# Patient Record
Sex: Female | Born: 1950 | Race: White | Hispanic: No | Marital: Single
Health system: Southern US, Community
[De-identification: ages and names within clinical notes are randomized; demographics above are authoritative.]

---

## 2011-02-20 ENCOUNTER — Inpatient Hospital Stay
Admission: AD | Admit: 2011-02-20 | Discharge: 2011-03-17 | Disposition: A | Discharge status: Skilled Nursing Facility | Payer: Self-pay | Source: Ambulatory Visit | Attending: Internal Medicine | Admitting: Internal Medicine

## 2011-02-20 ENCOUNTER — Other Ambulatory Visit (HOSPITAL_COMMUNITY): Payer: Self-pay

## 2011-02-20 LAB — BLOOD GAS, ARTERIAL
Bicarbonate: 25.9 mEq/L — ABNORMAL HIGH (ref 20.0–24.0)
Patient temperature: 98.6
TCO2: 27.3 mmol/L (ref 0–100)
pH, Arterial: 7.392 (ref 7.350–7.400)

## 2011-02-20 LAB — PROTIME-INR
INR: 1.48 (ref 0.00–1.49)
Prothrombin Time: 18.2 seconds — ABNORMAL HIGH (ref 11.6–15.2)

## 2011-02-20 LAB — PHOSPHORUS: Phosphorus: 3.8 mg/dL (ref 2.3–4.6)

## 2011-02-20 LAB — CBC
HCT: 31.5 % — ABNORMAL LOW (ref 36.0–46.0)
Hemoglobin: 9.9 g/dL — ABNORMAL LOW (ref 12.0–15.0)
MCHC: 31.4 g/dL (ref 30.0–36.0)
RBC: 3.01 MIL/uL — ABNORMAL LOW (ref 3.87–5.11)

## 2011-02-20 LAB — BASIC METABOLIC PANEL
CO2: 27 mEq/L (ref 19–32)
Calcium: 9.9 mg/dL (ref 8.4–10.5)
Chloride: 114 mEq/L — ABNORMAL HIGH (ref 96–112)
Glucose, Bld: 184 mg/dL — ABNORMAL HIGH (ref 70–99)
Potassium: 3.7 mEq/L (ref 3.5–5.1)
Sodium: 150 mEq/L — ABNORMAL HIGH (ref 135–145)

## 2011-02-20 LAB — LITHIUM LEVEL: Lithium Lvl: 1.04 mEq/L (ref 0.80–1.40)

## 2011-02-20 LAB — AMMONIA: Ammonia: 52 umol/L (ref 11–60)

## 2011-02-21 ENCOUNTER — Other Ambulatory Visit (HOSPITAL_COMMUNITY): Payer: Self-pay

## 2011-02-21 ENCOUNTER — Inpatient Hospital Stay (HOSPITAL_COMMUNITY)
Admission: AD | Admit: 2011-02-21 | Discharge: 2011-02-21 | Disposition: A | Discharge status: Home / Self Care | Payer: Self-pay | Source: Ambulatory Visit | Attending: Internal Medicine | Admitting: Internal Medicine

## 2011-02-21 DIAGNOSIS — J810 Acute pulmonary edema: Secondary | ICD-10-CM

## 2011-02-21 DIAGNOSIS — J690 Pneumonitis due to inhalation of food and vomit: Secondary | ICD-10-CM

## 2011-02-21 DIAGNOSIS — G9341 Metabolic encephalopathy: Secondary | ICD-10-CM

## 2011-02-21 DIAGNOSIS — R0902 Hypoxemia: Secondary | ICD-10-CM

## 2011-02-21 DIAGNOSIS — K921 Melena: Secondary | ICD-10-CM

## 2011-02-21 DIAGNOSIS — K7290 Hepatic failure, unspecified without coma: Secondary | ICD-10-CM

## 2011-02-21 LAB — BASIC METABOLIC PANEL
BUN: 57 mg/dL — ABNORMAL HIGH (ref 6–23)
CO2: 27 mEq/L (ref 19–32)
Calcium: 9.1 mg/dL (ref 8.4–10.5)
Creatinine, Ser: 2.03 mg/dL — ABNORMAL HIGH (ref 0.50–1.10)
GFR calc non Af Amer: 26 mL/min — ABNORMAL LOW (ref >90)
Glucose, Bld: 501 mg/dL — ABNORMAL HIGH (ref 70–99)

## 2011-02-21 LAB — CBC
HCT: 31.4 % — ABNORMAL LOW (ref 36.0–46.0)
MCHC: 30.6 g/dL (ref 30.0–36.0)
MCV: 105.7 fL — ABNORMAL HIGH (ref 78.0–100.0)
Platelets: 72 10*3/uL — ABNORMAL LOW (ref 150–400)
RDW: 16.5 % — ABNORMAL HIGH (ref 11.5–15.5)
WBC: 9.3 10*3/uL (ref 4.0–10.5)

## 2011-02-22 DIAGNOSIS — K921 Melena: Secondary | ICD-10-CM

## 2011-02-22 DIAGNOSIS — K7460 Unspecified cirrhosis of liver: Secondary | ICD-10-CM

## 2011-02-22 DIAGNOSIS — K766 Portal hypertension: Secondary | ICD-10-CM

## 2011-02-22 DIAGNOSIS — K7290 Hepatic failure, unspecified without coma: Secondary | ICD-10-CM

## 2011-02-22 LAB — CBC
HCT: 31.3 % — ABNORMAL LOW (ref 36.0–46.0)
HCT: 30.3 % — ABNORMAL LOW (ref 36.0–46.0)
Hemoglobin: 9.5 g/dL — ABNORMAL LOW (ref 12.0–15.0)
Hemoglobin: 9.3 g/dL — ABNORMAL LOW (ref 12.0–15.0)
MCH: 32.6 pg (ref 26.0–34.0)
MCHC: 30.7 g/dL (ref 30.0–36.0)
MCHC: 30.3 g/dL (ref 30.0–36.0)
MCV: 105.7 fL — ABNORMAL HIGH (ref 78.0–100.0)
MCV: 106.3 fL — ABNORMAL HIGH (ref 78.0–100.0)
MCV: 107.3 fL — ABNORMAL HIGH (ref 78.0–100.0)
Platelets: 65 10*3/uL — ABNORMAL LOW (ref 150–400)
Platelets: 66 10*3/uL — ABNORMAL LOW (ref 150–400)
RBC: 2.85 MIL/uL — ABNORMAL LOW (ref 3.87–5.11)
RDW: 16.3 % — ABNORMAL HIGH (ref 11.5–15.5)
RDW: 16.2 % — ABNORMAL HIGH (ref 11.5–15.5)
RDW: 16.2 % — ABNORMAL HIGH (ref 11.5–15.5)
WBC: 7.3 10*3/uL (ref 4.0–10.5)
WBC: 6.2 10*3/uL (ref 4.0–10.5)
WBC: 5.7 10*3/uL (ref 4.0–10.5)

## 2011-02-22 LAB — COMPREHENSIVE METABOLIC PANEL
ALT: 50 U/L — ABNORMAL HIGH (ref 0–35)
AST: 59 U/L — ABNORMAL HIGH (ref 0–37)
Albumin: 2.9 g/dL — ABNORMAL LOW (ref 3.5–5.2)
Alkaline Phosphatase: 293 U/L — ABNORMAL HIGH (ref 39–117)
BUN: 56 mg/dL — ABNORMAL HIGH (ref 6–23)
CO2: 28 mEq/L (ref 19–32)
Calcium: 9.3 mg/dL (ref 8.4–10.5)
Chloride: 118 mEq/L — ABNORMAL HIGH (ref 96–112)
Creatinine, Ser: 1.85 mg/dL — ABNORMAL HIGH (ref 0.50–1.10)
GFR calc Af Amer: 33 mL/min — ABNORMAL LOW (ref >90)
GFR calc non Af Amer: 29 mL/min — ABNORMAL LOW (ref >90)
Glucose, Bld: 225 mg/dL — ABNORMAL HIGH (ref 70–99)
Potassium: 3.6 mEq/L (ref 3.5–5.1)
Sodium: 151 mEq/L — ABNORMAL HIGH (ref 135–145)
Total Bilirubin: 2.3 mg/dL — ABNORMAL HIGH (ref 0.3–1.2)
Total Protein: 5.8 g/dL — ABNORMAL LOW (ref 6.0–8.3)

## 2011-02-22 LAB — PROTIME-INR
INR: 1.5 — ABNORMAL HIGH (ref 0.00–1.49)
Prothrombin Time: 18.4 seconds — ABNORMAL HIGH (ref 11.6–15.2)

## 2011-02-22 LAB — TSH: TSH: 1.152 u[IU]/mL (ref 0.350–4.500)

## 2011-02-23 ENCOUNTER — Other Ambulatory Visit (HOSPITAL_COMMUNITY): Payer: Self-pay

## 2011-02-23 LAB — COMPREHENSIVE METABOLIC PANEL
ALT: 47 U/L — ABNORMAL HIGH (ref 0–35)
AST: 49 U/L — ABNORMAL HIGH (ref 0–37)
Albumin: 3.1 g/dL — ABNORMAL LOW (ref 3.5–5.2)
Alkaline Phosphatase: 279 U/L — ABNORMAL HIGH (ref 39–117)
Glucose, Bld: 247 mg/dL — ABNORMAL HIGH (ref 70–99)
Potassium: 3.8 mEq/L (ref 3.5–5.1)
Sodium: 153 mEq/L — ABNORMAL HIGH (ref 135–145)
Total Protein: 5.8 g/dL — ABNORMAL LOW (ref 6.0–8.3)

## 2011-02-23 LAB — CBC
Hemoglobin: 9.4 g/dL — ABNORMAL LOW (ref 12.0–15.0)
MCHC: 30.6 g/dL (ref 30.0–36.0)
Platelets: 78 10*3/uL — ABNORMAL LOW (ref 150–400)
RDW: 16.1 % — ABNORMAL HIGH (ref 11.5–15.5)

## 2011-02-23 LAB — CULTURE, BLOOD (ROUTINE X 2)

## 2011-02-24 DIAGNOSIS — K766 Portal hypertension: Secondary | ICD-10-CM

## 2011-02-24 DIAGNOSIS — K7682 Hepatic encephalopathy: Secondary | ICD-10-CM

## 2011-02-24 DIAGNOSIS — K7460 Unspecified cirrhosis of liver: Secondary | ICD-10-CM

## 2011-02-24 DIAGNOSIS — J810 Acute pulmonary edema: Secondary | ICD-10-CM

## 2011-02-24 DIAGNOSIS — R040 Epistaxis: Secondary | ICD-10-CM

## 2011-02-24 DIAGNOSIS — J690 Pneumonitis due to inhalation of food and vomit: Secondary | ICD-10-CM

## 2011-02-24 DIAGNOSIS — R0902 Hypoxemia: Secondary | ICD-10-CM

## 2011-02-24 DIAGNOSIS — K7290 Hepatic failure, unspecified without coma: Secondary | ICD-10-CM

## 2011-02-24 DIAGNOSIS — K921 Melena: Secondary | ICD-10-CM

## 2011-02-24 LAB — CBC
HCT: 30.7 % — ABNORMAL LOW (ref 36.0–46.0)
Hemoglobin: 9.3 g/dL — ABNORMAL LOW (ref 12.0–15.0)
MCH: 32.2 pg (ref 26.0–34.0)
MCV: 106.2 fL — ABNORMAL HIGH (ref 78.0–100.0)
RBC: 2.89 MIL/uL — ABNORMAL LOW (ref 3.87–5.11)
WBC: 4.8 10*3/uL (ref 4.0–10.5)

## 2011-02-24 LAB — COMPREHENSIVE METABOLIC PANEL
CO2: 28 mEq/L (ref 19–32)
Calcium: 9.1 mg/dL (ref 8.4–10.5)
Chloride: 115 mEq/L — ABNORMAL HIGH (ref 96–112)
Creatinine, Ser: 1.62 mg/dL — ABNORMAL HIGH (ref 0.50–1.10)
GFR calc Af Amer: 39 mL/min — ABNORMAL LOW (ref >90)
GFR calc non Af Amer: 34 mL/min — ABNORMAL LOW (ref >90)
Glucose, Bld: 303 mg/dL — ABNORMAL HIGH (ref 70–99)
Total Bilirubin: 2 mg/dL — ABNORMAL HIGH (ref 0.3–1.2)

## 2011-02-24 LAB — CLOSTRIDIUM DIFFICILE BY PCR: Toxigenic C. Difficile by PCR: NEGATIVE

## 2011-02-24 NOTE — Procedures (Signed)
EEG NUMBER:  [id number].  This routine EEG was requested in this 60 year old woman who is on CPAP and admitted with a metabolic encephalopathy with loss of consciousness. Her medications include lamotrigine.  The EEG was done with the patient awake and drowsy.  During periods of maximal wakefulness, background activities are characterized by low to medium amplitude, mixed frequency theta activities.  Note was made of sporadic triphasic-like waves that were frontally dominant, and had an AP lag.  These seemed to wax and wane with state.  Photic stimulation did not produce clear driving response.  The patient was not hyperventilated.  The patient did appear to fall asleep.  This was characterized by the emergence of slower, poorly organized delta activities.  Again seen were the triphasic waves.  CLINICAL INTERPRETATION:  This routine EEG done with the patient awake and drowsy is abnormal.  Background activities are too slow for her age and state suggesting a moderate encephalopathy of nonspecific etiology. No electrographic seizures, epileptiform discharges or nonconvulsive status epilepticus was seen.          ______________________________ Denton Meek, MD    [id number] D:  02/21/2011 16:32:17  T:  02/21/2011 16:56:17  Job #:  [id number]

## 2011-02-25 LAB — CROSSMATCH
DAT, IgG: NEGATIVE
Donor AG Type: NEGATIVE
Unit division: 0
Unit division: 0

## 2011-02-25 LAB — BASIC METABOLIC PANEL
BUN: 40 mg/dL — ABNORMAL HIGH (ref 6–23)
Calcium: 8.9 mg/dL (ref 8.4–10.5)
GFR calc Af Amer: 45 mL/min — ABNORMAL LOW (ref >90)
GFR calc non Af Amer: 39 mL/min — ABNORMAL LOW (ref >90)
Glucose, Bld: 286 mg/dL — ABNORMAL HIGH (ref 70–99)
Potassium: 3.8 mEq/L (ref 3.5–5.1)
Sodium: 146 mEq/L — ABNORMAL HIGH (ref 135–145)

## 2011-02-26 LAB — CBC
Hemoglobin: 9.4 g/dL — ABNORMAL LOW (ref 12.0–15.0)
MCH: 32.8 pg (ref 26.0–34.0)
MCHC: 31.6 g/dL (ref 30.0–36.0)
Platelets: 63 10*3/uL — ABNORMAL LOW (ref 150–400)
RDW: 16.1 % — ABNORMAL HIGH (ref 11.5–15.5)

## 2011-02-26 LAB — BASIC METABOLIC PANEL
Calcium: 8.7 mg/dL (ref 8.4–10.5)
GFR calc Af Amer: 43 mL/min — ABNORMAL LOW (ref >90)
GFR calc non Af Amer: 37 mL/min — ABNORMAL LOW (ref >90)
Potassium: 3.9 mEq/L (ref 3.5–5.1)
Sodium: 144 mEq/L (ref 135–145)

## 2011-02-27 ENCOUNTER — Other Ambulatory Visit (HOSPITAL_COMMUNITY): Payer: Self-pay

## 2011-02-27 LAB — CULTURE, BLOOD (ROUTINE X 2)
Culture  Setup Time: 201210120143
Culture: NO GROWTH

## 2011-02-27 LAB — BASIC METABOLIC PANEL
BUN: 41 mg/dL — ABNORMAL HIGH (ref 6–23)
CO2: 26 mEq/L (ref 19–32)
Calcium: 8.8 mg/dL (ref 8.4–10.5)
Creatinine, Ser: 1.5 mg/dL — ABNORMAL HIGH (ref 0.50–1.10)
GFR calc non Af Amer: 37 mL/min — ABNORMAL LOW (ref >90)
Glucose, Bld: 254 mg/dL — ABNORMAL HIGH (ref 70–99)
Sodium: 142 mEq/L (ref 135–145)

## 2011-02-28 LAB — CBC
HCT: 28.5 % — ABNORMAL LOW (ref 36.0–46.0)
MCH: 32.1 pg (ref 26.0–34.0)
MCV: 102.9 fL — ABNORMAL HIGH (ref 78.0–100.0)
Platelets: 57 10*3/uL — ABNORMAL LOW (ref 150–400)
RDW: 16.5 % — ABNORMAL HIGH (ref 11.5–15.5)
WBC: 5.9 10*3/uL (ref 4.0–10.5)

## 2011-02-28 LAB — BASIC METABOLIC PANEL
BUN: 44 mg/dL — ABNORMAL HIGH (ref 6–23)
Calcium: 8.9 mg/dL (ref 8.4–10.5)
Chloride: 108 mEq/L (ref 96–112)
Creatinine, Ser: 1.64 mg/dL — ABNORMAL HIGH (ref 0.50–1.10)
GFR calc Af Amer: 39 mL/min — ABNORMAL LOW (ref >90)

## 2011-03-01 LAB — BASIC METABOLIC PANEL
CO2: 27 mEq/L (ref 19–32)
Calcium: 8.8 mg/dL (ref 8.4–10.5)
Creatinine, Ser: 1.68 mg/dL — ABNORMAL HIGH (ref 0.50–1.10)
GFR calc non Af Amer: 32 mL/min — ABNORMAL LOW (ref >90)
Glucose, Bld: 160 mg/dL — ABNORMAL HIGH (ref 70–99)

## 2011-03-01 LAB — CULTURE, BLOOD (ROUTINE X 2)
Culture  Setup Time: 201210141853
Culture: NO GROWTH

## 2011-03-01 LAB — MAGNESIUM: Magnesium: 2.7 mg/dL — ABNORMAL HIGH (ref 1.5–2.5)

## 2011-03-02 LAB — BASIC METABOLIC PANEL
BUN: 48 mg/dL — ABNORMAL HIGH (ref 6–23)
Chloride: 103 mEq/L (ref 96–112)
Creatinine, Ser: 1.68 mg/dL — ABNORMAL HIGH (ref 0.50–1.10)
GFR calc Af Amer: 37 mL/min — ABNORMAL LOW (ref >90)
GFR calc non Af Amer: 32 mL/min — ABNORMAL LOW (ref >90)
Potassium: 4.6 mEq/L (ref 3.5–5.1)

## 2011-03-02 LAB — CBC
HCT: 25.7 % — ABNORMAL LOW (ref 36.0–46.0)
MCHC: 32.3 g/dL (ref 30.0–36.0)
MCV: 102 fL — ABNORMAL HIGH (ref 78.0–100.0)
Platelets: 52 10*3/uL — ABNORMAL LOW (ref 150–400)
RDW: 16.5 % — ABNORMAL HIGH (ref 11.5–15.5)

## 2011-03-02 LAB — MAGNESIUM: Magnesium: 2.8 mg/dL — ABNORMAL HIGH (ref 1.5–2.5)

## 2011-03-03 LAB — CULTURE, BLOOD (SINGLE): Culture  Setup Time: 201210160220

## 2011-03-03 LAB — URINALYSIS, MICROSCOPIC ONLY
Bilirubin Urine: NEGATIVE
Ketones, ur: NEGATIVE mg/dL
Protein, ur: NEGATIVE mg/dL
Urobilinogen, UA: 0.2 mg/dL (ref 0.0–1.0)

## 2011-03-03 LAB — BASIC METABOLIC PANEL
Calcium: 9 mg/dL (ref 8.4–10.5)
Creatinine, Ser: 1.67 mg/dL — ABNORMAL HIGH (ref 0.50–1.10)
GFR calc Af Amer: 38 mL/min — ABNORMAL LOW (ref >90)
GFR calc non Af Amer: 33 mL/min — ABNORMAL LOW (ref >90)
Sodium: 138 mEq/L (ref 135–145)

## 2011-03-03 LAB — CALCIUM, IONIZED: Calcium, Ion: 1.19 mmol/L (ref 1.12–1.32)

## 2011-03-03 LAB — CBC
MCH: 32.5 pg (ref 26.0–34.0)
MCV: 101.6 fL — ABNORMAL HIGH (ref 78.0–100.0)
Platelets: 45 10*3/uL — ABNORMAL LOW (ref 150–400)
RDW: 16.6 % — ABNORMAL HIGH (ref 11.5–15.5)

## 2011-03-03 LAB — MAGNESIUM: Magnesium: 2.7 mg/dL — ABNORMAL HIGH (ref 1.5–2.5)

## 2011-03-04 ENCOUNTER — Other Ambulatory Visit (HOSPITAL_COMMUNITY): Payer: Self-pay

## 2011-03-04 DIAGNOSIS — I059 Rheumatic mitral valve disease, unspecified: Secondary | ICD-10-CM

## 2011-03-04 LAB — BASIC METABOLIC PANEL
Chloride: 104 mEq/L (ref 96–112)
Creatinine, Ser: 1.63 mg/dL — ABNORMAL HIGH (ref 0.50–1.10)
GFR calc Af Amer: 39 mL/min — ABNORMAL LOW (ref >90)
Potassium: 3.8 mEq/L (ref 3.5–5.1)
Sodium: 140 mEq/L (ref 135–145)

## 2011-03-06 LAB — CBC
HCT: 23.2 % — ABNORMAL LOW (ref 36.0–46.0)
RBC: 2.34 MIL/uL — ABNORMAL LOW (ref 3.87–5.11)
RDW: 16.2 % — ABNORMAL HIGH (ref 11.5–15.5)
WBC: 4.3 10*3/uL (ref 4.0–10.5)

## 2011-03-06 LAB — BASIC METABOLIC PANEL
BUN: 44 mg/dL — ABNORMAL HIGH (ref 6–23)
Chloride: 102 mEq/L (ref 96–112)
GFR calc Af Amer: 37 mL/min — ABNORMAL LOW (ref >90)
Potassium: 3.3 mEq/L — ABNORMAL LOW (ref 3.5–5.1)

## 2011-03-06 LAB — HEMOGLOBIN AND HEMATOCRIT, BLOOD
HCT: 26.4 % — ABNORMAL LOW (ref 36.0–46.0)
Hemoglobin: 8.5 g/dL — ABNORMAL LOW (ref 12.0–15.0)

## 2011-03-06 LAB — IRON AND TIBC: Iron: 56 ug/dL (ref 42–135)

## 2011-03-06 LAB — MAGNESIUM: Magnesium: 2.5 mg/dL (ref 1.5–2.5)

## 2011-03-06 NOTE — Consult Note (Signed)
  NAMEJENNET, Bethany [name]                 ACCOUNT NO.:  [id number]  MEDICAL RECORD NO.:  [id number]  LOCATION:                                 FACILITY:  PHYSICIAN:  Noralyn Pick. Eden Emms, MD, [facility]   DATE OF BIRTH:  DATE OF CONSULTATION: DATE OF DISCHARGE:                                CONSULTATION   TRANSESOPHAGEAL ECHO  INDICATION:  A 60 year old patient with question sepsis, rule out SBE.  The patient was extremely debilitated.  She was extremely high risk for aspiration.  She has had this clinically before.  She is morbidly obese. Mental status was off and on.  The patient has cirrhosis.  We managed to give her 6 mg of Versed and did not feel comfortable giving her any more.  She had copious secretions and had to be suctioned throughout the case.  However, despite all this, the TEE was performed in a safe manner with sating above 90%.  A note should be made that the primary service was concerned about SBE, however, I noted that her last blood cultures were negative and I saw no evidence of previous transthoracic echo being done.  Left ventricular cavity size and function were normal.  EF was 60%. There was no regional motion abnormality.  No mural apical thrombus. Mitral valve was mildly thickened.  There was no SBE.  There was mild MR.  Left atrium was normal.  Right atrium was mildly dilated.  Right ventricle was normal.  Tricuspid valve was mildly thickened with moderate TR which I suspect is from her underlying sleep apnea or lung disease.  Aortic valve was trileaflet, normal.  Pulmonary valve was normal with trivial PR.  There was no evidence of SBE.  Atrial septum was intact with no PFO and no ASD.  Left atrial appendage was normal with no spontaneous contrast or thrombus.  Imaging of the aorta showed no debris.  FINAL IMPRESSION: 1. No subacute bacterial endocarditis. 2. Moderate tricuspid regurgitation. 3. Mild mitral regurgitation. 4. Normal aortic valve. 5. Normal  pulmonary valve. 6. Normal left ventricular ejection fraction 60%. 7. Normal right ventricle. 8. Mild right atrial enlargement. 9. No aortic debris.     Noralyn Pick. Eden Emms, MD, [facility]     PCN/MEDQ  D:  03/04/2011  T:  03/04/2011  Job:  [id number]  Electronically Signed by Charlton Haws MD [facility] on 03/06/2011 12:29:56 PM

## 2011-03-07 LAB — BASIC METABOLIC PANEL
Chloride: 101 mEq/L (ref 96–112)
Creatinine, Ser: 1.6 mg/dL — ABNORMAL HIGH (ref 0.50–1.10)
GFR calc Af Amer: 40 mL/min — ABNORMAL LOW (ref >90)
Potassium: 4 mEq/L (ref 3.5–5.1)

## 2011-03-07 LAB — HEMOGLOBIN AND HEMATOCRIT, BLOOD
HCT: 24.8 % — ABNORMAL LOW (ref 36.0–46.0)
Hemoglobin: 8.1 g/dL — ABNORMAL LOW (ref 12.0–15.0)

## 2011-03-07 LAB — MAGNESIUM: Magnesium: 2.4 mg/dL (ref 1.5–2.5)

## 2011-03-08 LAB — CBC
HCT: 23 % — ABNORMAL LOW (ref 36.0–46.0)
Hemoglobin: 7.6 g/dL — ABNORMAL LOW (ref 12.0–15.0)
MCV: 98.7 fL (ref 78.0–100.0)
Platelets: 41 10*3/uL — ABNORMAL LOW (ref 150–400)
RBC: 2.33 MIL/uL — ABNORMAL LOW (ref 3.87–5.11)
WBC: 5.3 10*3/uL (ref 4.0–10.5)

## 2011-03-08 LAB — BASIC METABOLIC PANEL
BUN: 32 mg/dL — ABNORMAL HIGH (ref 6–23)
CO2: 29 mEq/L (ref 19–32)
Chloride: 100 mEq/L (ref 96–112)
Creatinine, Ser: 1.62 mg/dL — ABNORMAL HIGH (ref 0.50–1.10)
Glucose, Bld: 123 mg/dL — ABNORMAL HIGH (ref 70–99)

## 2011-03-08 LAB — MAGNESIUM: Magnesium: 2.3 mg/dL (ref 1.5–2.5)

## 2011-03-09 LAB — HEMOGLOBIN AND HEMATOCRIT, BLOOD: HCT: 25.1 % — ABNORMAL LOW (ref 36.0–46.0)

## 2011-03-10 LAB — COMPREHENSIVE METABOLIC PANEL
ALT: 40 U/L — ABNORMAL HIGH (ref 0–35)
AST: 58 U/L — ABNORMAL HIGH (ref 0–37)
CO2: 30 mEq/L (ref 19–32)
Chloride: 100 mEq/L (ref 96–112)
GFR calc non Af Amer: 30 mL/min — ABNORMAL LOW (ref >90)
Sodium: 138 mEq/L (ref 135–145)
Total Bilirubin: 2.2 mg/dL — ABNORMAL HIGH (ref 0.3–1.2)

## 2011-03-10 LAB — CBC
Hemoglobin: 8.3 g/dL — ABNORMAL LOW (ref 12.0–15.0)
MCV: 98.1 fL (ref 78.0–100.0)
Platelets: 47 10*3/uL — ABNORMAL LOW (ref 150–400)
RBC: 2.58 MIL/uL — ABNORMAL LOW (ref 3.87–5.11)
WBC: 4.8 10*3/uL (ref 4.0–10.5)

## 2011-03-10 LAB — CROSSMATCH
Donor AG Type: NEGATIVE
Unit division: 0

## 2011-03-10 LAB — DIFFERENTIAL
Eosinophils Absolute: 0.2 10*3/uL (ref 0.0–0.7)
Lymphs Abs: 1.1 10*3/uL (ref 0.7–4.0)
Neutro Abs: 3.1 10*3/uL (ref 1.7–7.7)
Neutrophils Relative %: 64 % (ref 43–77)

## 2011-03-12 LAB — DIFFERENTIAL
Basophils Absolute: 0 10*3/uL (ref 0.0–0.1)
Basophils Relative: 0 % (ref 0–1)
Eosinophils Absolute: 0.2 10*3/uL (ref 0.0–0.7)
Eosinophils Relative: 5 % (ref 0–5)
Monocytes Absolute: 0.5 10*3/uL (ref 0.1–1.0)

## 2011-03-12 LAB — CBC
MCHC: 32.3 g/dL (ref 30.0–36.0)
RDW: 16.5 % — ABNORMAL HIGH (ref 11.5–15.5)

## 2011-03-12 LAB — PROTIME-INR
INR: 1.18 (ref 0.00–1.49)
Prothrombin Time: 15.3 seconds — ABNORMAL HIGH (ref 11.6–15.2)

## 2011-03-12 LAB — COMPREHENSIVE METABOLIC PANEL
Albumin: 2.9 g/dL — ABNORMAL LOW (ref 3.5–5.2)
BUN: 24 mg/dL — ABNORMAL HIGH (ref 6–23)
Chloride: 98 mEq/L (ref 96–112)
Creatinine, Ser: 1.66 mg/dL — ABNORMAL HIGH (ref 0.50–1.10)
Total Bilirubin: 2.5 mg/dL — ABNORMAL HIGH (ref 0.3–1.2)
Total Protein: 5.9 g/dL — ABNORMAL LOW (ref 6.0–8.3)

## 2011-03-14 LAB — BASIC METABOLIC PANEL
GFR calc non Af Amer: 32 mL/min — ABNORMAL LOW (ref >90)
Glucose, Bld: 150 mg/dL — ABNORMAL HIGH (ref 70–99)
Potassium: 3.5 mEq/L (ref 3.5–5.1)
Sodium: 135 mEq/L (ref 135–145)

## 2011-03-16 LAB — BASIC METABOLIC PANEL
BUN: 17 mg/dL (ref 6–23)
Chloride: 99 mEq/L (ref 96–112)
GFR calc Af Amer: 42 mL/min — ABNORMAL LOW (ref >90)
Potassium: 4.6 mEq/L (ref 3.5–5.1)
Sodium: 135 mEq/L (ref 135–145)

## 2011-03-16 LAB — CBC
HCT: 25 % — ABNORMAL LOW (ref 36.0–46.0)
Hemoglobin: 8 g/dL — ABNORMAL LOW (ref 12.0–15.0)
MCHC: 32 g/dL (ref 30.0–36.0)
RBC: 2.48 MIL/uL — ABNORMAL LOW (ref 3.87–5.11)
WBC: 4 10*3/uL (ref 4.0–10.5)

## 2011-03-16 LAB — DIFFERENTIAL
Lymphocytes Relative: 21 % (ref 12–46)
Lymphs Abs: 0.9 10*3/uL (ref 0.7–4.0)
Monocytes Absolute: 0.4 10*3/uL (ref 0.1–1.0)
Monocytes Relative: 11 % (ref 3–12)
Neutro Abs: 2.6 10*3/uL (ref 1.7–7.7)

## 2011-03-17 LAB — CBC
HCT: 24.9 % — ABNORMAL LOW (ref 36.0–46.0)
Hemoglobin: 7.8 g/dL — ABNORMAL LOW (ref 12.0–15.0)
MCH: 31.6 pg (ref 26.0–34.0)
MCHC: 31.3 g/dL (ref 30.0–36.0)
RBC: 2.47 MIL/uL — ABNORMAL LOW (ref 3.87–5.11)

## 2011-03-17 LAB — DIFFERENTIAL
Lymphs Abs: 1.1 10*3/uL (ref 0.7–4.0)
Monocytes Relative: 10 % (ref 3–12)
Neutro Abs: 2.9 10*3/uL (ref 1.7–7.7)
Neutrophils Relative %: 62 % (ref 43–77)

## 2011-03-17 LAB — BASIC METABOLIC PANEL
BUN: 15 mg/dL (ref 6–23)
Chloride: 99 mEq/L (ref 96–112)
GFR calc Af Amer: 43 mL/min — ABNORMAL LOW (ref >90)
Glucose, Bld: 138 mg/dL — ABNORMAL HIGH (ref 70–99)
Potassium: 3.8 mEq/L (ref 3.5–5.1)

## 2013-10-21 IMAGING — CR DG ABD PORTABLE 1V
2 series · 2 of 2 positions shown · non-contrast
Comparison: 02/27/2011.

CLINICAL DATA: Cough.  Nausea and vomiting.

ABDOMEN - 1 VIEW

[AP (1 of 2)]
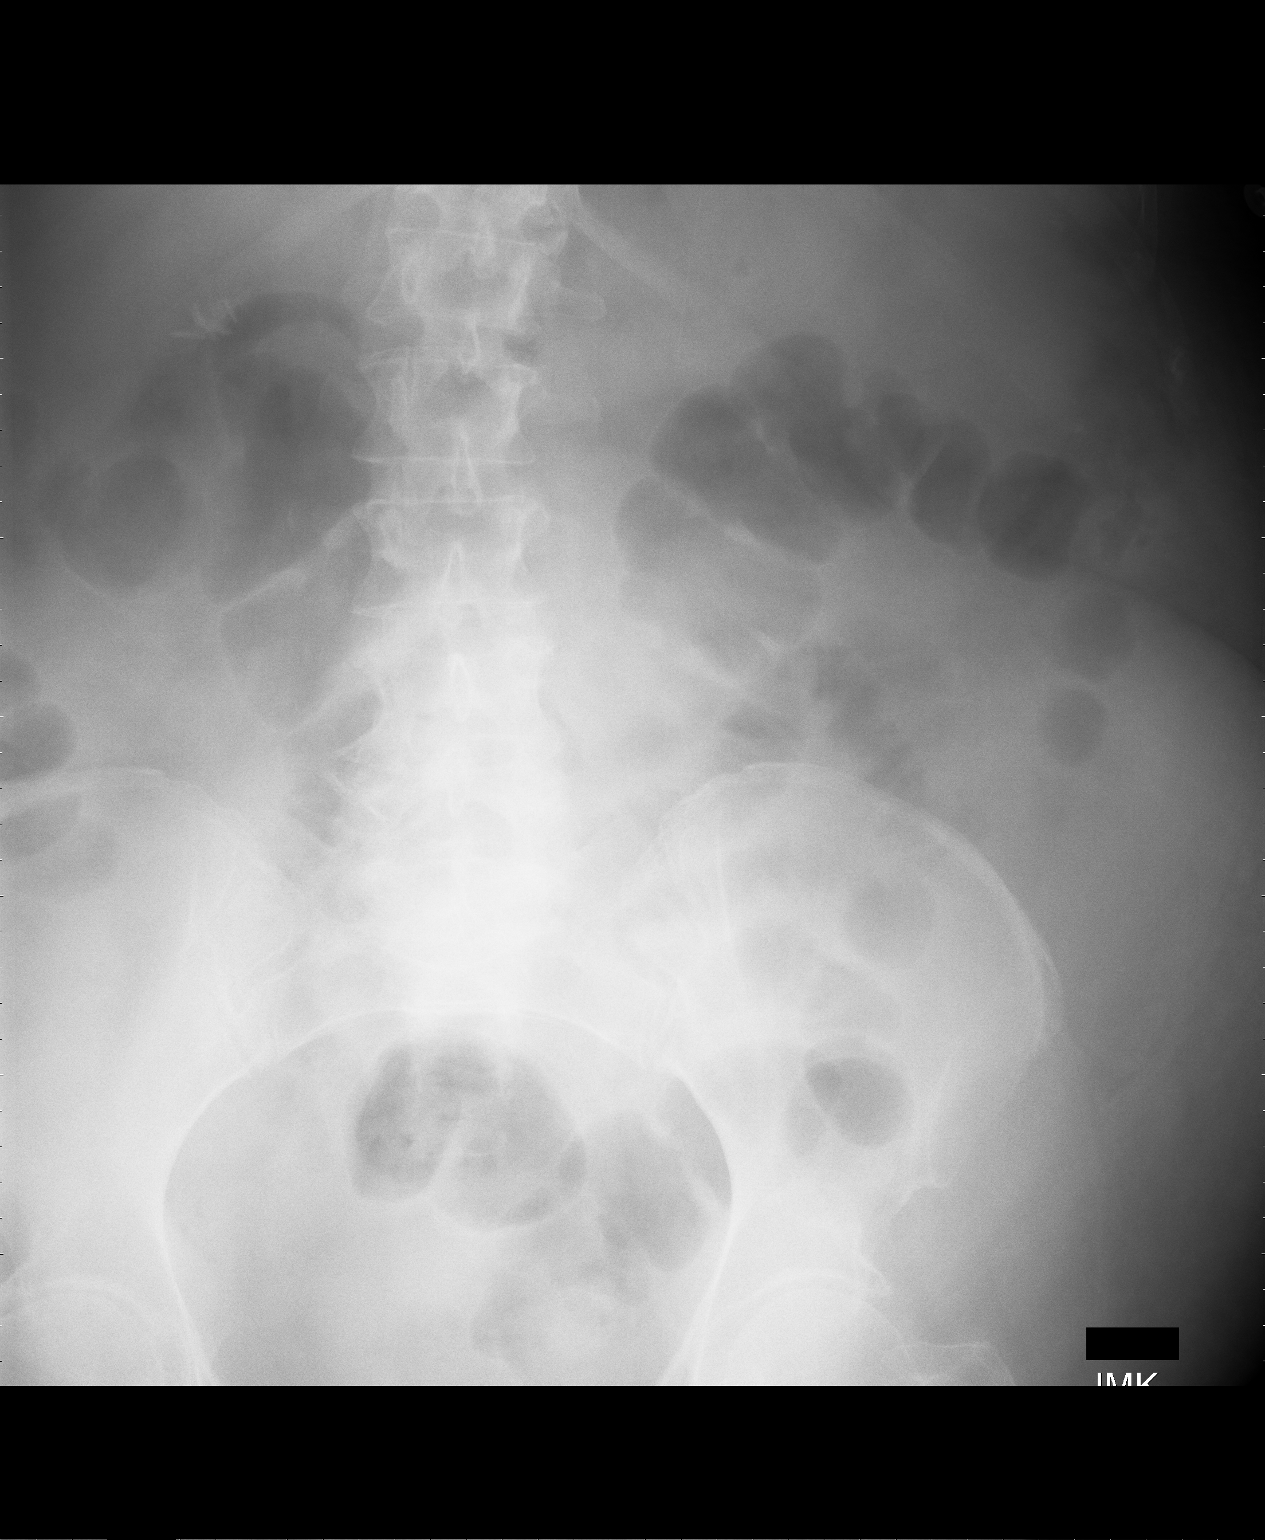

[AP (2 of 2)]
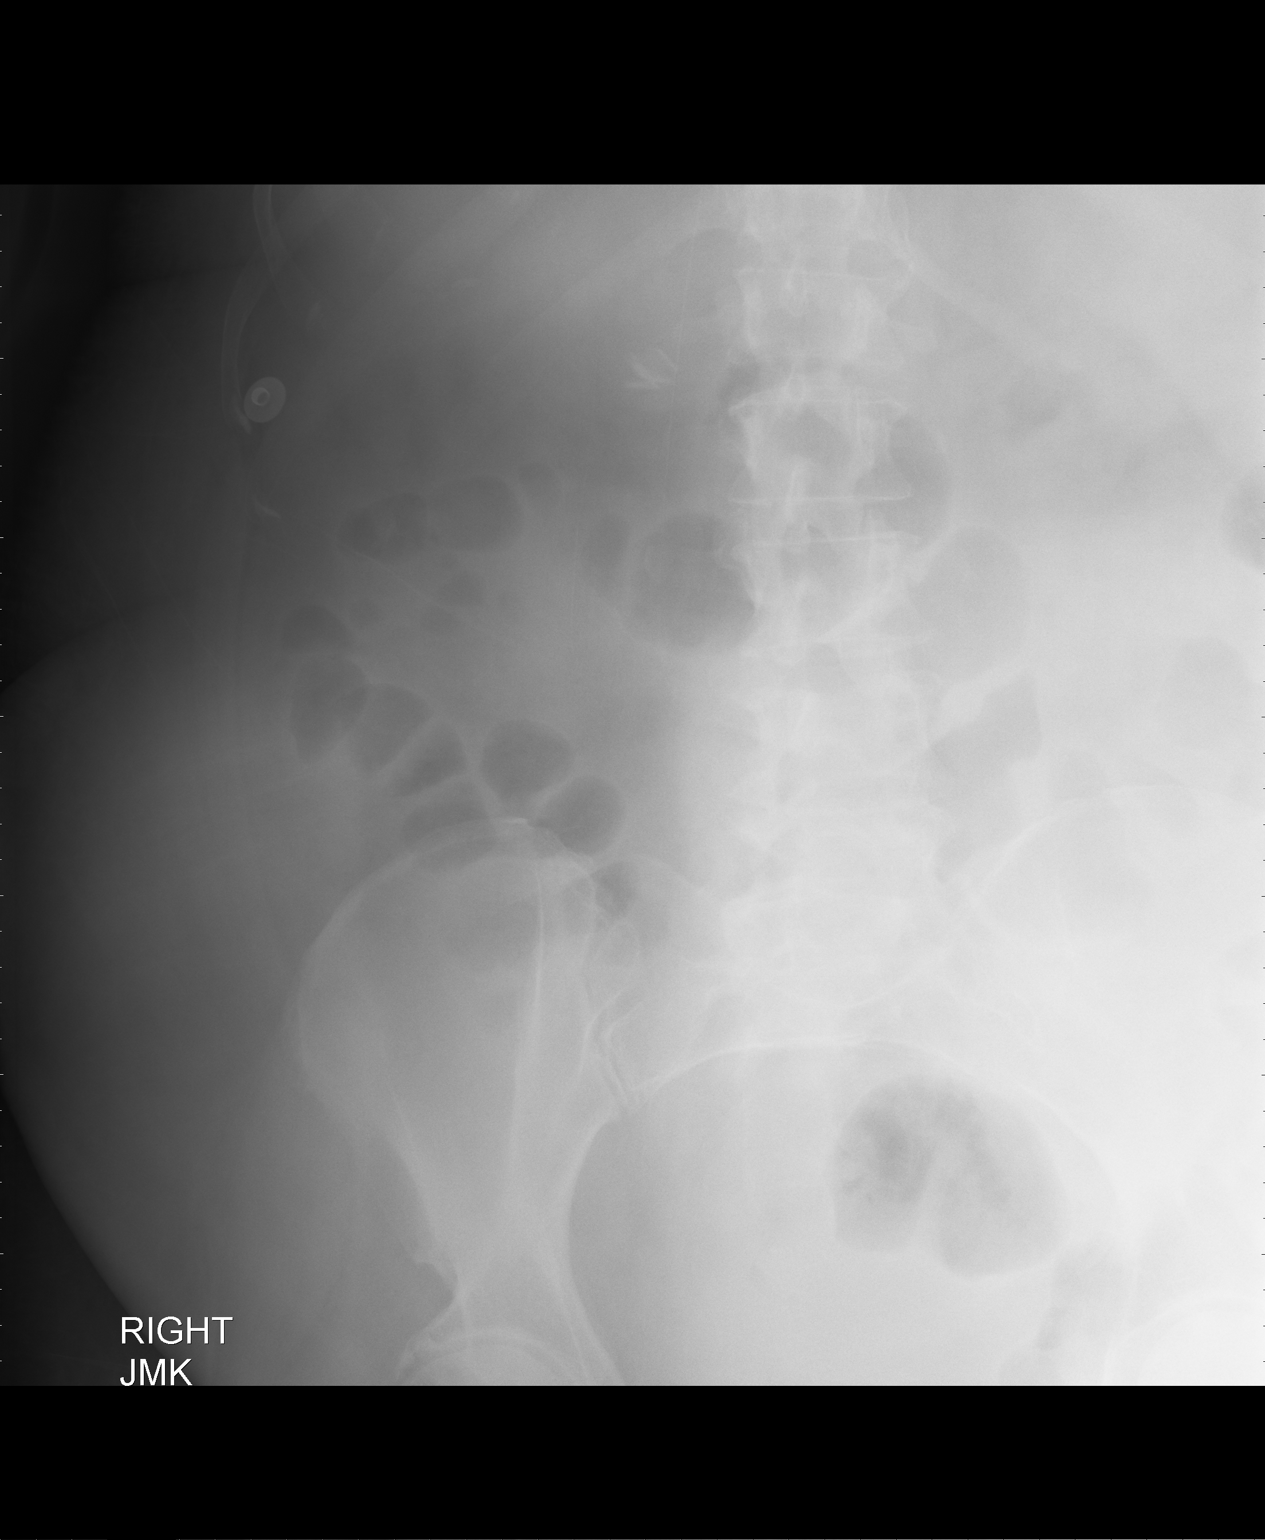

[2 of 2 positions shown; findings below may reference images not displayed]

FINDINGS: The bowel gas pattern is nonobstructive.  Mild gaseous
distention of colon. Cholecystectomy clips are present in the right
upper quadrant.  No interval change in the bowel gas pattern
compared to prior.  Nasogastric tube has been removed.
IMPRESSION: Nonobstructive bowel gas pattern.  Removal of nasogastric tube.

## 2013-10-21 IMAGING — CR DG CHEST 1V PORT
1 series · 1 of 1 positions shown · non-contrast
Comparison: 02/23/2011.

CLINICAL DATA: Cough.  Nausea.  Vomiting.

PORTABLE CHEST - 1 VIEW

[AP]
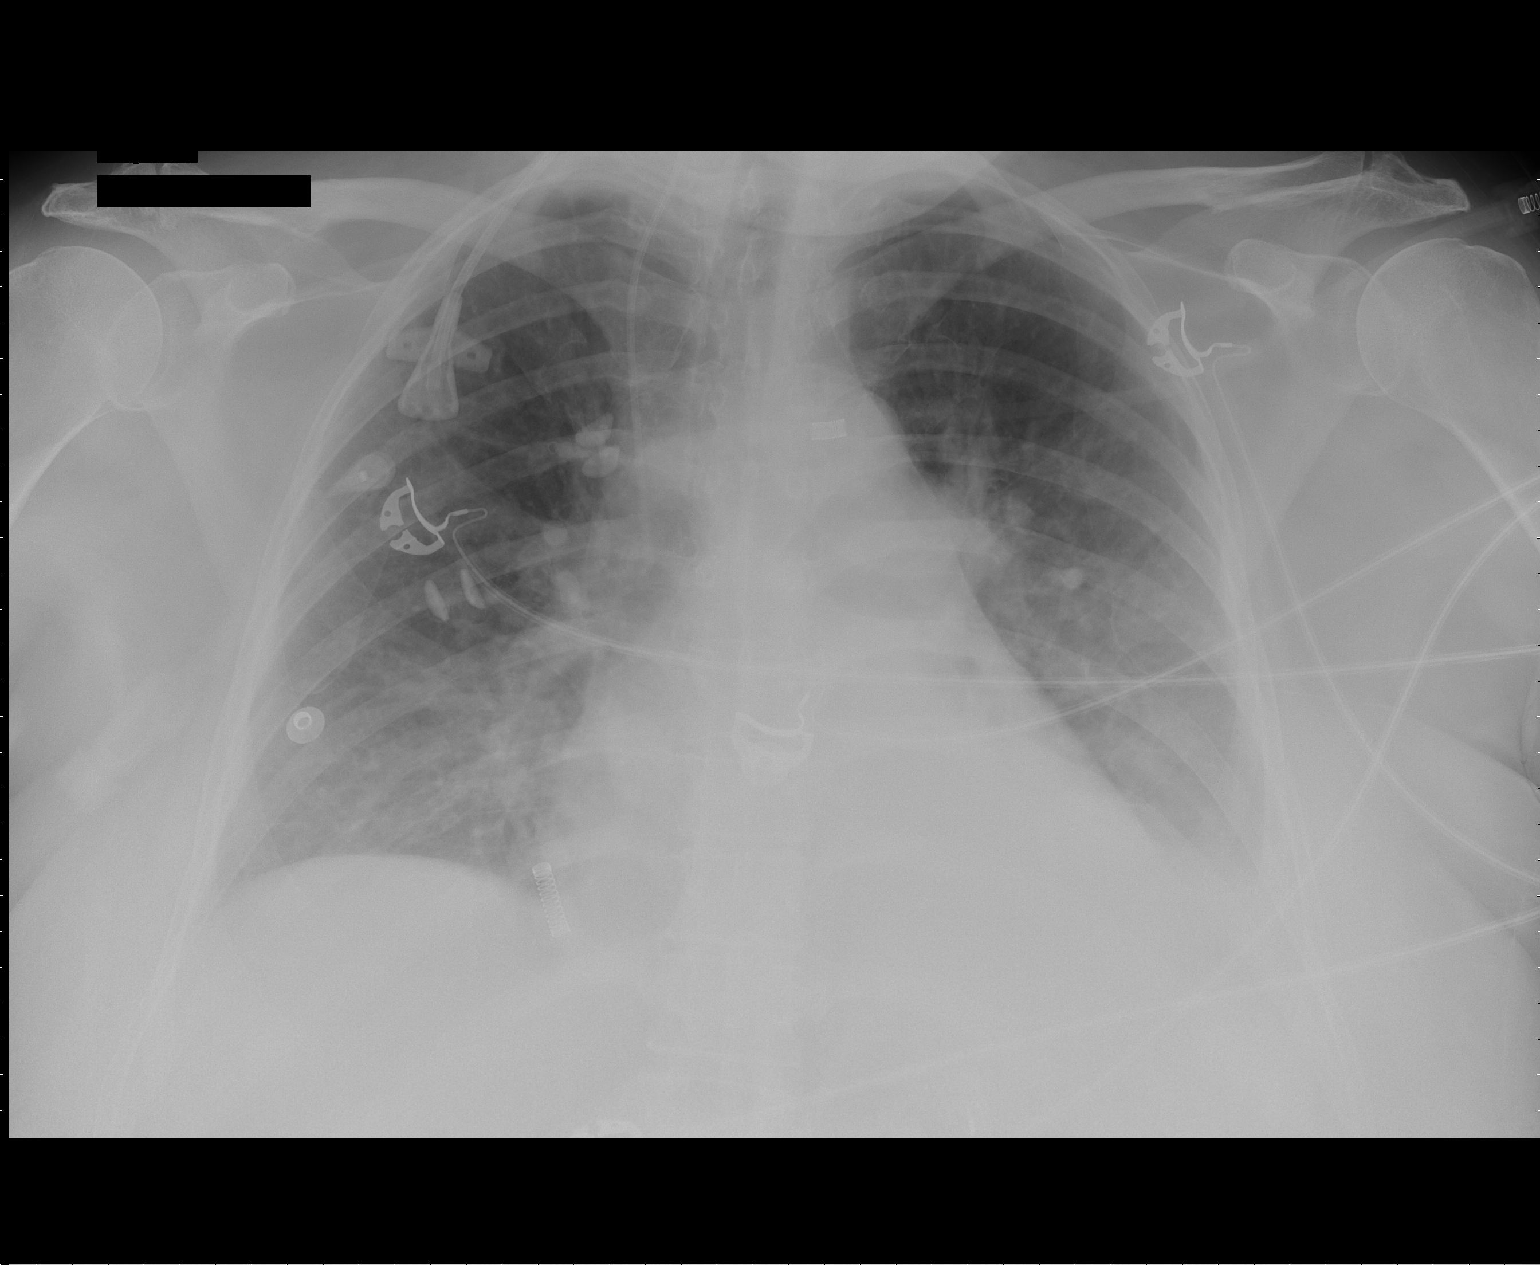

[1 of 1 positions shown; findings below may reference images not displayed]

FINDINGS: Right IJ central line is unchanged with the tip in the
mid SVC.  Low lung volumes.  Left pleural effusion.  Bilateral
basilar atelectasis and airspace disease.  Pulmonary vascular
congestion.  The appearance is most compatible with CHF.  Multi
focal pneumonia is considered less likely based on symmetry.  Dense
retrocardiac opacity likely represents atelectasis and effusion in
combination.  The ovoid radiopaque densities are projected over the
left chest, presumably external to the patient.  Cardiopericardial
silhouette appears unchanged compared to prior exam 02/23/2011.
IMPRESSION: 1.  Unchanged right IJ central line with the tip in the mid SVC.
2.  Unchanged atelectasis, basilar predominant pulmonary edema and
left  effusion. Findings most compatible with moderate CHF.

## 2015-05-01 ENCOUNTER — Inpatient Hospital Stay
Admission: AD | Admit: 2015-05-01 | Discharge: 2015-07-11 | Disposition: E | Payer: Self-pay | Source: Ambulatory Visit | Attending: Internal Medicine | Admitting: Internal Medicine

## 2015-05-01 ENCOUNTER — Institutional Professional Consult (permissible substitution) (HOSPITAL_COMMUNITY): Payer: Self-pay

## 2015-05-01 DIAGNOSIS — J189 Pneumonia, unspecified organism: Secondary | ICD-10-CM

## 2015-05-01 DIAGNOSIS — T829XXA Unspecified complication of cardiac and vascular prosthetic device, implant and graft, initial encounter: Secondary | ICD-10-CM

## 2015-05-01 DIAGNOSIS — J9620 Acute and chronic respiratory failure, unspecified whether with hypoxia or hypercapnia: Secondary | ICD-10-CM

## 2015-05-01 DIAGNOSIS — R7989 Other specified abnormal findings of blood chemistry: Secondary | ICD-10-CM

## 2015-05-01 DIAGNOSIS — R945 Abnormal results of liver function studies: Secondary | ICD-10-CM

## 2015-05-01 DIAGNOSIS — Z931 Gastrostomy status: Secondary | ICD-10-CM

## 2015-05-01 DIAGNOSIS — A159 Respiratory tuberculosis unspecified: Secondary | ICD-10-CM

## 2015-05-01 DIAGNOSIS — L988 Other specified disorders of the skin and subcutaneous tissue: Secondary | ICD-10-CM

## 2015-05-01 DIAGNOSIS — J1100 Influenza due to unidentified influenza virus with unspecified type of pneumonia: Secondary | ICD-10-CM

## 2015-05-01 DIAGNOSIS — J984 Other disorders of lung: Secondary | ICD-10-CM

## 2015-05-01 DIAGNOSIS — N19 Unspecified kidney failure: Secondary | ICD-10-CM

## 2015-05-01 DIAGNOSIS — J90 Pleural effusion, not elsewhere classified: Secondary | ICD-10-CM

## 2015-05-01 DIAGNOSIS — J9690 Respiratory failure, unspecified, unspecified whether with hypoxia or hypercapnia: Secondary | ICD-10-CM

## 2015-05-01 DIAGNOSIS — Z0189 Encounter for other specified special examinations: Secondary | ICD-10-CM

## 2015-05-01 DIAGNOSIS — Z431 Encounter for attention to gastrostomy: Secondary | ICD-10-CM

## 2015-05-01 DIAGNOSIS — R1110 Vomiting, unspecified: Secondary | ICD-10-CM

## 2015-05-01 LAB — BLOOD GAS, ARTERIAL
ACID-BASE DEFICIT: 8.3 mmol/L — AB (ref 0.0–2.0)
Bicarbonate: 17 mEq/L — ABNORMAL LOW (ref 20.0–24.0)
FIO2: 0.28
MECHVT: 500 mL
O2 Saturation: 98.7 %
PEEP/CPAP: 5 cmH2O
PO2 ART: 132 mmHg — AB (ref 80.0–100.0)
Patient temperature: 98
RATE: 12 resp/min
TCO2: 18.1 mmol/L (ref 0–100)
pCO2 arterial: 35.2 mmHg (ref 35.0–45.0)
pH, Arterial: 7.301 — ABNORMAL LOW (ref 7.350–7.450)

## 2015-05-02 LAB — CBC WITH DIFFERENTIAL/PLATELET
Basophils Absolute: 0 10*3/uL (ref 0.0–0.1)
Basophils Relative: 0 %
EOS PCT: 0 %
Eosinophils Absolute: 0 10*3/uL (ref 0.0–0.7)
HCT: 28.9 % — ABNORMAL LOW (ref 36.0–46.0)
Hemoglobin: 9.3 g/dL — ABNORMAL LOW (ref 12.0–15.0)
LYMPHS ABS: 0.7 10*3/uL (ref 0.7–4.0)
LYMPHS PCT: 13 %
MCH: 31 pg (ref 26.0–34.0)
MCHC: 32.2 g/dL (ref 30.0–36.0)
MCV: 96.3 fL (ref 78.0–100.0)
MONO ABS: 0.3 10*3/uL (ref 0.1–1.0)
Monocytes Relative: 6 %
Neutro Abs: 4.1 10*3/uL (ref 1.7–7.7)
Neutrophils Relative %: 81 %
PLATELETS: 55 10*3/uL — AB (ref 150–400)
RBC: 3 MIL/uL — AB (ref 3.87–5.11)
RDW: 20.4 % — ABNORMAL HIGH (ref 11.5–15.5)
WBC: 5.1 10*3/uL (ref 4.0–10.5)

## 2015-05-02 LAB — TSH: TSH: 21.89 u[IU]/mL — ABNORMAL HIGH (ref 0.350–4.500)

## 2015-05-02 LAB — APTT: aPTT: 45 seconds — ABNORMAL HIGH (ref 24–37)

## 2015-05-02 LAB — VANCOMYCIN, RANDOM: VANCOMYCIN RM: 19 ug/mL

## 2015-05-02 LAB — PHOSPHORUS: Phosphorus: 5.3 mg/dL — ABNORMAL HIGH (ref 2.5–4.6)

## 2015-05-02 LAB — MAGNESIUM: Magnesium: 3.1 mg/dL — ABNORMAL HIGH (ref 1.7–2.4)

## 2015-05-03 LAB — CBC WITH DIFFERENTIAL/PLATELET
BASOS ABS: 0 10*3/uL (ref 0.0–0.1)
Basophils Relative: 0 %
EOS PCT: 0 %
Eosinophils Absolute: 0 10*3/uL (ref 0.0–0.7)
HCT: 26.5 % — ABNORMAL LOW (ref 36.0–46.0)
Hemoglobin: 8.5 g/dL — ABNORMAL LOW (ref 12.0–15.0)
LYMPHS ABS: 0.5 10*3/uL — AB (ref 0.7–4.0)
Lymphocytes Relative: 13 %
MCH: 30.8 pg (ref 26.0–34.0)
MCHC: 32.1 g/dL (ref 30.0–36.0)
MCV: 96 fL (ref 78.0–100.0)
MONO ABS: 0.3 10*3/uL (ref 0.1–1.0)
MONOS PCT: 8 %
NEUTROS PCT: 79 %
Neutro Abs: 2.9 10*3/uL (ref 1.7–7.7)
PLATELETS: 39 10*3/uL — AB (ref 150–400)
RBC: 2.76 MIL/uL — AB (ref 3.87–5.11)
RDW: 20.8 % — ABNORMAL HIGH (ref 11.5–15.5)
WBC: 3.6 10*3/uL — ABNORMAL LOW (ref 4.0–10.5)

## 2015-05-03 LAB — COMPREHENSIVE METABOLIC PANEL
ALBUMIN: 2.4 g/dL — AB (ref 3.5–5.0)
ALK PHOS: 209 U/L — AB (ref 38–126)
ALT: 63 U/L — AB (ref 14–54)
AST: 157 U/L — AB (ref 15–41)
Anion gap: 13 (ref 5–15)
BUN: 116 mg/dL — ABNORMAL HIGH (ref 6–20)
CALCIUM: 8.2 mg/dL — AB (ref 8.9–10.3)
CHLORIDE: 120 mmol/L — AB (ref 101–111)
CO2: 16 mmol/L — AB (ref 22–32)
CREATININE: 4.97 mg/dL — AB (ref 0.44–1.00)
GFR calc Af Amer: 10 mL/min — ABNORMAL LOW (ref >60)
GFR calc non Af Amer: 8 mL/min — ABNORMAL LOW (ref >60)
GLUCOSE: 206 mg/dL — AB (ref 65–99)
Potassium: 4.1 mmol/L (ref 3.5–5.1)
SODIUM: 149 mmol/L — AB (ref 135–145)
Total Bilirubin: 6.9 mg/dL — ABNORMAL HIGH (ref 0.3–1.2)
Total Protein: 5.4 g/dL — ABNORMAL LOW (ref 6.5–8.1)

## 2015-05-03 LAB — PTH, INTACT AND CALCIUM
Calcium, Total (PTH): 7.8 mg/dL — ABNORMAL LOW (ref 8.7–10.3)
PTH: 152 pg/mL — ABNORMAL HIGH (ref 15–65)

## 2015-05-04 LAB — BLOOD GAS, ARTERIAL
Acid-base deficit: 10.4 mmol/L — ABNORMAL HIGH (ref 0.0–2.0)
Bicarbonate: 14.8 mEq/L — ABNORMAL LOW (ref 20.0–24.0)
FIO2: 0.28
MODE: POSITIVE
O2 SAT: 98.9 %
PEEP/CPAP: 5 cmH2O
Patient temperature: 98.6
Pressure support: 5 cmH2O
TCO2: 15.8 mmol/L (ref 0–100)
pCO2 arterial: 31.5 mmHg — ABNORMAL LOW (ref 35.0–45.0)
pH, Arterial: 7.294 — ABNORMAL LOW (ref 7.350–7.450)
pO2, Arterial: 132 mmHg — ABNORMAL HIGH (ref 80.0–100.0)

## 2015-05-05 ENCOUNTER — Other Ambulatory Visit (HOSPITAL_COMMUNITY): Payer: Self-pay

## 2015-05-05 LAB — CBC WITH DIFFERENTIAL/PLATELET
BASOS ABS: 0 10*3/uL (ref 0.0–0.1)
BASOS PCT: 0 %
EOS ABS: 0 10*3/uL (ref 0.0–0.7)
EOS PCT: 0 %
HCT: 28.7 % — ABNORMAL LOW (ref 36.0–46.0)
HEMOGLOBIN: 9.1 g/dL — AB (ref 12.0–15.0)
LYMPHS ABS: 0.5 10*3/uL — AB (ref 0.7–4.0)
Lymphocytes Relative: 8 %
MCH: 30.8 pg (ref 26.0–34.0)
MCHC: 31.7 g/dL (ref 30.0–36.0)
MCV: 97.3 fL (ref 78.0–100.0)
MONO ABS: 0.4 10*3/uL (ref 0.1–1.0)
Monocytes Relative: 7 %
NEUTROS ABS: 5.3 10*3/uL (ref 1.7–7.7)
NEUTROS PCT: 85 %
Platelets: 58 10*3/uL — ABNORMAL LOW (ref 150–400)
RBC: 2.95 MIL/uL — ABNORMAL LOW (ref 3.87–5.11)
RDW: 21.9 % — AB (ref 11.5–15.5)
WBC: 6.2 10*3/uL (ref 4.0–10.5)

## 2015-05-05 LAB — BLOOD GAS, ARTERIAL
Acid-base deficit: 11.8 mmol/L — ABNORMAL HIGH (ref 0.0–2.0)
Bicarbonate: 13.9 mEq/L — ABNORMAL LOW (ref 20.0–24.0)
FIO2: 0.28
MECHVT: 500 mL
O2 SAT: 98 %
PCO2 ART: 32.1 mmHg — AB (ref 35.0–45.0)
PEEP: 5 cmH2O
PH ART: 7.26 — AB (ref 7.350–7.450)
Patient temperature: 98.8
RATE: 12 resp/min
TCO2: 14.9 mmol/L (ref 0–100)
pO2, Arterial: 114 mmHg — ABNORMAL HIGH (ref 80.0–100.0)

## 2015-05-05 LAB — COMPREHENSIVE METABOLIC PANEL
ALBUMIN: 2.4 g/dL — AB (ref 3.5–5.0)
ALT: 55 U/L — AB (ref 14–54)
AST: 137 U/L — AB (ref 15–41)
Alkaline Phosphatase: 255 U/L — ABNORMAL HIGH (ref 38–126)
Anion gap: 12 (ref 5–15)
BUN: 129 mg/dL — AB (ref 6–20)
CHLORIDE: 119 mmol/L — AB (ref 101–111)
CO2: 16 mmol/L — AB (ref 22–32)
CREATININE: 6.09 mg/dL — AB (ref 0.44–1.00)
Calcium: 8.1 mg/dL — ABNORMAL LOW (ref 8.9–10.3)
GFR calc Af Amer: 8 mL/min — ABNORMAL LOW (ref >60)
GFR calc non Af Amer: 7 mL/min — ABNORMAL LOW (ref >60)
GLUCOSE: 289 mg/dL — AB (ref 65–99)
Potassium: 4.1 mmol/L (ref 3.5–5.1)
SODIUM: 147 mmol/L — AB (ref 135–145)
Total Bilirubin: 7 mg/dL — ABNORMAL HIGH (ref 0.3–1.2)
Total Protein: 5.9 g/dL — ABNORMAL LOW (ref 6.5–8.1)

## 2015-05-05 LAB — VANCOMYCIN, TROUGH: VANCOMYCIN TR: 33 ug/mL — AB (ref 10.0–20.0)

## 2015-05-05 LAB — AMMONIA: AMMONIA: 38 umol/L — AB (ref 9–35)

## 2015-05-05 LAB — LACTIC ACID, PLASMA: LACTIC ACID, VENOUS: 1.1 mmol/L (ref 0.5–2.0)

## 2015-05-06 LAB — BLOOD GAS, ARTERIAL
ACID-BASE DEFICIT: 11.4 mmol/L — AB (ref 0.0–2.0)
BICARBONATE: 14.2 meq/L — AB (ref 20.0–24.0)
FIO2: 0.28
LHR: 12 {breaths}/min
O2 Saturation: 98.5 %
PEEP/CPAP: 5 cmH2O
PO2 ART: 130 mmHg — AB (ref 80.0–100.0)
Patient temperature: 98.6
TCO2: 15.2 mmol/L (ref 0–100)
VT: 500 mL
pCO2 arterial: 32.1 mmHg — ABNORMAL LOW (ref 35.0–45.0)
pH, Arterial: 7.269 — ABNORMAL LOW (ref 7.350–7.450)

## 2015-05-07 ENCOUNTER — Other Ambulatory Visit (HOSPITAL_COMMUNITY): Payer: Self-pay

## 2015-05-07 DIAGNOSIS — J9620 Acute and chronic respiratory failure, unspecified whether with hypoxia or hypercapnia: Secondary | ICD-10-CM

## 2015-05-07 DIAGNOSIS — J1100 Influenza due to unidentified influenza virus with unspecified type of pneumonia: Secondary | ICD-10-CM

## 2015-05-07 DIAGNOSIS — J9621 Acute and chronic respiratory failure with hypoxia: Secondary | ICD-10-CM

## 2015-05-07 LAB — CREATININE, SERUM
CREATININE: 7.56 mg/dL — AB (ref 0.44–1.00)
GFR calc Af Amer: 6 mL/min — ABNORMAL LOW (ref >60)
GFR calc non Af Amer: 5 mL/min — ABNORMAL LOW (ref >60)

## 2015-05-07 LAB — VANCOMYCIN, RANDOM: VANCOMYCIN RM: 30 ug/mL

## 2015-05-07 NOTE — Consult Note (Signed)
PCCM PROGRESS NOTE  ADMISSION DATE: 04/25/2015 CONSULT DATE: 12/26  REFERRING PROVIDER: Sharyon MedicusHijazi   CC/reason for consult: Vent weaning   HPI 64 y.o. female with a PMHx of diabetes mellitus, liver cirrhosis,chronic kidney disease stage IV baseline creatinine 2.8, thyroid disease, bipolar disorder, GERD, recurrent urinary tract infections, chronic pancytopenia. [facility] was [facility]. She presented with increasing swelling at that point in time and also has shortness of breath. She was found to have left lower lobe pneumonia and influenza B. Infection. Hospital course was complicated by acute respiratory failure, new-onset seizure disorder and progressive renal failure felt to be hepatorenal syndrome.. She ultimately underwent intubation for airway protection. Admitted to [facility] on 04/24/2015 for ongoing treatment of acute respiratory failure, liver cirrhosis, acute renal failure.  PMH diabetes mellitus, liver cirrhosis,chronic kidney disease stage IV (creat 2.8), thyroid disease, bipolar disorder, GERD, recurrent urinary tract infections, chronic pancytopenia  Allergies: Penicillin, codeine, sulfa, thiazides, dapsone, hydrocodone,fosamprenavir, loop diuretics  Current medications: Bicarbonate drip, meropenem 500 mg IV 3 times a day, sinus L. Insulin, folic acid 1 mg daily heparin 5000 units subcutaneous every 8 hours, lactulose 20 g 4 times a day, Lantus 15 units each bedtime, Keppra 500 mg daily, Synthroid 0.175 mg daily, Nephro-Vite one tablet daily, oxcarbazepine 300 mg 3 times a day, Protonix 40 once daily, thiamine 100 mg daily, Xifaxan 550 mg twice a day   Past Surgical History: No past surgical history on file.   Family History: Unable to obtain from the patient as she is currently on the ventilator  ROS unable  SUBJECTIVE: Appears comfortable   OBJECTIVE: 11.9 hr 88 rr 28 bp 105/63 sats 99%  General: chronically ill appearing  64 year old female. Awake, currently on full vent support  HEENT: sclera are icteric, orally intubated. + JVD Cardiac: rrr, no MRG Chest: scattered rhonchi, no accessory muscle use  Abd: obese + bowel sounds  Ext: scattered areas of ecchymosis 2+ pulses  Neuro: follows commands, generalized weakness  Skin: jaundice, + LE edema    CMP Latest Ref Rng 05/07/2015 05/05/2015 05/03/2015  Glucose 65 - 99 mg/dL - 147(W289(H) 295(A206(H)  BUN 6 - 20 mg/dL - 213(Y129(H) 865(H116(H)  Creatinine 0.44 - 1.00 mg/dL 8.46(N7.56(H) 6.29(B6.09(H) 2.84(X4.97(H)  Sodium 135 - 145 mmol/L - 147(H) 149(H)  Potassium 3.5 - 5.1 mmol/L - 4.1 4.1  Chloride 101 - 111 mmol/L - 119(H) 120(H)  CO2 22 - 32 mmol/L - 16(L) 16(L)  Calcium 8.9 - 10.3 mg/dL - 8.1(L) 8.2(L)  Total Protein 6.5 - 8.1 g/dL - 5.9(L) 5.4(L)  Total Bilirubin 0.3 - 1.2 mg/dL - 7.0(H) 6.9(H)  Alkaline Phos 38 - 126 U/L - 255(H) 209(H)  AST 15 - 41 U/L - 137(H) 157(H)  ALT 14 - 54 U/L - 55(H) 63(H)     CBC Latest Ref Rng 05/05/2015 05/03/2015 05/02/2015  WBC 4.0 - 10.5 K/uL 6.2 3.6(L) 5.1  Hemoglobin 12.0 - 15.0 g/dL 3.2(G9.1(L) 4.0(N8.5(L) 0.2(V9.3(L)  Hematocrit 36.0 - 46.0 % 28.7(L) 26.5(L) 28.9(L)  Platelets 150 - 400 K/uL 58(L) 39(L) 55(L)     [place] Renal Port  05/05/2015  CLINICAL DATA:  Acute renal failure EXAM: RENAL / URINARY TRACT ULTRASOUND COMPLETE COMPARISON:  None. FINDINGS: Right Kidney: Length: 9.7 cm. Echogenicity within normal limits. No mass or hydronephrosis visualized. Left Kidney: Length: 10 cm. Echogenicity within normal limits. No mass or hydronephrosis visualized. Bladder: There is a Foley catheter within decompressed urinary bladder. Abdominal and pelvic ascites is noted. IMPRESSION: No  hydronephrosis. Foley catheter within decompressed urinary bladder. Abdominal ascites. Electronically Signed   By: Natasha Mead M.D.   On: 05/05/2015 17:40    STUDIES:  EVENTS:   DISCUSSION: This is a 64 year old cirrhotic w/ what was initially either ATN or hepato-renal syndrome,  now w/ progressive renal decline to point she needs HD. Should be able to resume weaning after HD and volume removal. Ideally would prefer to avoid trach. Long term prognosis for this patient is extremely poor. Would recommend goals of care discussion w/ family.   ASSESSMENT/PLAN:  Ventilator dependence in setting of hepatic/metabolic encephalopathy and recent CAP. She is debilitated and also has volume overload from her progressive renal disease. Was weaning but this is on hold for HD cath.  Plan PCXR Minimize sedation  Plan for HD, then would resume weaning efforts Hopefully she can come off vent after starts HD   TME/Hepatic Encephalopathy Recent seizures  Plan Cont lactulose and Xifaxan  HD planned Limit sedating meds Cont keppra  Acute on chronic renal failure w/ metabolic acidosis  Plan For HD Plan per nephrology   End-stage Cirrhosis  Plan Cont lactulose and xifaxan   Debilitation  Plan PT/OT  Simonne Martinet ACNP-BC [facility] Pulmonary/Critical Care Pager # [phone] OR # [phone] if no answer

## 2015-05-07 NOTE — Consult Note (Signed)
[facility] CONSULT NOTE    Date: 05/07/2015                  Patient Name:  Bethany [name]  MRN: [id number]  DOB: Aug 04, 1950  Age / Sex: 64 y.o., female         PCP: No PCP Per Patient                 Service Requesting Consult: Dr. Adin Hector                 Reason for Consult: Acute renal failure            History of Present Illness: Patient is a 64 y.o. female with a PMHx of diabetes mellitus, liver cirrhosis,chronic kidney disease stage IV baseline creatinine 2.8, thyroid disease, bipolar disorder, GERD, recurrent urinary tract infections, chronic pancytopenia, who was admitted to [facility] on 04/25/2015 for ongoing treatment of acute respiratory failure, liver cirrhosis, acute renal failure.  She was originally admitted to [facility]. She presented with increasing swelling at that point in time and also has shortness of breath. She was found to have left lower lobe pneumonia and influenza B. Infection. Hospital course was complicated by acute respiratory failure as well as new-onset seizure disorder. She ultimately underwent intubation for airway protection. In regards to her underlying renal function her presenting creatinine at [facility] was found to be 2.8. With diuresis creatinine was up to 3.3. She was being monitored by nephrology in [place]. She was given octreotide for consideration of hepatorenal syndrome.renal function now continues to worsen. Creatinine is up to 7.5. Patient has also developed significant acidosis with most recent serum bicarbonate of 16. I had a conversation with the patient's daughter today discussing further treatment options including renal replacement therapy. The patient's daughter was agreeable to renal replacement therapy.   Medications: Current medications: Bicarbonate drip, meropenem 500 mg IV 3 times a day, sinus L. Insulin, folic acid 1 mg daily heparin 5000 units subcutaneous every 8 hours,  lactulose 20 g 4 times a day, Lantus 15 units each bedtime, Keppra 500 mg daily, Synthroid 0.175 mg daily, Nephro-Vite one tablet daily, oxcarbazepine 300 mg 3 times a day, Protonix 40 once daily, thiamine 100 mg daily, Xifaxan 550 mg twice a day    Allergies: Penicillin, codeine, sulfa, thiazides, dapsone, hydrocodone,fosamprenavir, loop diuretics   Past Medical History: diabetes mellitus, liver cirrhosis,chronic kidney disease stage IV baseline creatinine 2.8, thyroid disease, bipolar disorder, GERD, recurrent urinary tract infections, chronic pancytopenia   Past Surgical History: No past surgical history on file.   Family History: Unable to obtain from the patient as she is currently on the ventilator.  Social History: Unable to obtain from the patient as she is currently on the ventilator.  Review of Systems: Unable to obtain from the patient as she is currently on the ventilator.  Vital Signs: Temperature 100.9, pulse 80, respirations 20, blood pressure 155/63 Weight trends: There were no vitals filed for this visit.  Physical Exam: General: Critically ill-appearing  Head: Normocephalic, atraumatic.  Eyes: Anicteric, pupils equal round reactive to light  Nose: Nasogastric tube in place  Throat: Endotracheal tube in place  Neck: Supple, trachea midline.  Lungs:  bilateral rhonchi, vent assisted  Heart: RRR. S1 and S2 normal without gallop, murmur, or rubs.  Abdomen:  Abdomen distended, bowel sounds present.  Extremities: 1+ bilateral lower extremity edema  Neurologic: awake but not following commands.  Skin: No visible rashes, scars.  Lab results: Basic Metabolic Panel:  Recent Labs Lab 05/02/15 0708 05/03/15 1040 05/05/15 0603 05/07/15 0745  NA  --  149* 147*  --   K  --  4.1 4.1  --   CL  --  120* 119*  --   CO2  --  16* 16*  --   GLUCOSE  --  206* 289*  --   BUN  --  116* 129*  --   CREATININE  --  4.97* 6.09* 7.56*  CALCIUM 7.8* 8.2* 8.1*  --   MG  3.1*  --   --   --   PHOS 5.3*  --   --   --     Liver Function Tests:  Recent Labs Lab 05/03/15 1040 05/05/15 0603  AST 157* 137*  ALT 63* 55*  ALKPHOS 209* 255*  BILITOT 6.9* 7.0*  PROT 5.4* 5.9*  ALBUMIN 2.4* 2.4*   No results for input(s): LIPASE, AMYLASE in the last 168 hours.  Recent Labs Lab 05/05/15 1125  AMMONIA 38*    CBC:  Recent Labs Lab 05/02/15 0708 05/03/15 1040 05/05/15 0603  WBC 5.1 3.6* 6.2  NEUTROABS 4.1 2.9 5.3  HGB 9.3* 8.5* 9.1*  HCT 28.9* 26.5* 28.7*  MCV 96.3 96.0 97.3  PLT 55* 39* 58*    Cardiac Enzymes: No results for input(s): CKTOTAL, CKMB, CKMBINDEX, TROPONINI in the last 168 hours.  BNP: Invalid input(s): POCBNP  CBG: No results for input(s): GLUCAP in the last 168 hours.  Microbiology: Results for orders placed or performed during the hospital encounter of 02/20/11  Culture, blood (routine x 2)     Status: None   Collection Time: 02/20/11  6:43 PM  Result Value Ref Range Status   Specimen Description BLOOD LEFT HAND  Final   Special Requests BOTTLES DRAWN AEROBIC AND ANAEROBIC 10CC  Final   Culture  Setup Time [id number]  Final   Culture   Final    METHICILLIN RESISTANT STAPHYLOCOCCUS AUREUS Note: RIFAMPIN AND GENTAMICIN SHOULD NOT BE USED AS SINGLE DRUGS FOR TREATMENT OF STAPH INFECTIONS. This organism is presumed to be Clindamycin resistant based on detection of inducible Clindamycin resistance. Note: Gram Stain Report Called to,Read Back By and Verified With: Fernande Boyden 02/21/11 @ 2111 HAJAM CRITICAL RESULT CALLED TO, READ BACK BY AND VERIFIED WITH: RN D. RIVERA ON 02/23/11 BY TEDAR   Report Status 02/23/2011 FINAL  Final   Organism ID, Bacteria METHICILLIN RESISTANT STAPHYLOCOCCUS AUREUS  Final      Susceptibility   Methicillin resistant staphylococcus aureus - MIC*    CLINDAMYCIN RESISTANT      ERYTHROMYCIN >=8 Resistant     GENTAMICIN <=0.5 Sensitive     LEVOFLOXACIN >=8 Resistant     OXACILLIN >=4  Resistant     PENICILLIN >=0.5 Resistant     RIFAMPIN <=0.5 Sensitive     TRIMETH/SULFA 160 Resistant     VANCOMYCIN <=0.5 Sensitive     TETRACYCLINE <=1 Sensitive     * METHICILLIN RESISTANT STAPHYLOCOCCUS AUREUS  Culture, blood (routine x 2)     Status: None   Collection Time: 02/20/11  7:05 PM  Result Value Ref Range Status   Specimen Description BLOOD RIGHT HAND  Final   Special Requests BOTTLES DRAWN AEROBIC AND ANAEROBIC 10CC  Final   Culture  Setup Time [id number]  Final   Culture NO GROWTH 5 DAYS  Final   Report Status 02/27/2011 FINAL  Final  Culture, blood (routine x 2)  Status: None   Collection Time: 02/23/11 12:20 PM  Result Value Ref Range Status   Specimen Description BLOOD RIGHT ARM  Final   Special Requests BOTTLES DRAWN AEROBIC ONLY [facility]  Final   Culture  Setup Time [id number]  Final   Culture NO GROWTH 5 DAYS  Final   Report Status 03/01/2011 FINAL  Final  Culture, blood (routine x 2)     Status: None   Collection Time: 02/23/11 12:30 PM  Result Value Ref Range Status   Specimen Description BLOOD PICC LINE  Final   Special Requests   Final    BOTTLES DRAWN AEROBIC AND ANAEROBIC 10CC RIGHT JUGULAR PICC LINE   Culture  Setup Time [id number]  Final   Culture NO GROWTH 5 DAYS  Final   Report Status 03/01/2011 FINAL  Final  Clostridium Difficile by PCR     Status: None   Collection Time: 02/24/11  4:25 PM  Result Value Ref Range Status   Toxigenic C Difficile by pcr NEGATIVE NEGATIVE Final  Culture, blood (routine single)     Status: None   Collection Time: 02/24/11  6:15 PM  Result Value Ref Range Status   Specimen Description BLOOD PICC LINE  Final   Special Requests BOTTLES DRAWN AEROBIC AND ANAEROBIC 10CC  Final   Culture  Setup Time [id number]  Final   Culture NO GROWTH 5 DAYS  Final   Report Status 03/03/2011 FINAL  Final    Coagulation Studies: No results for input(s): LABPROT, INR in the last 72 hours.  Urinalysis: No results for  input(s): COLORURINE, LABSPEC, PHURINE, GLUCOSEU, HGBUR, BILIRUBINUR, KETONESUR, PROTEINUR, UROBILINOGEN, NITRITE, LEUKOCYTESUR in the last 72 hours.  Invalid input(s): APPERANCEUR    Imaging: US Renal Port  05/05/2015  CLINICAL DATA:  Acute renal failure EXAM: RENAL / URINARY TRACT ULTRASOUND COMPLETE COMPARISON:  None. FINDINGS: Right Kidney: Length: 9.7 cm. Echogenicity within normal limits. No mass or hydronephrosis visualized. Left Kidney: Length: 10 cm. Echogenicity within normal limits. No mass or hydronephrosis visualized. Bladder: There is a Foley catheter within decompressed urinary bladder. Abdominal and pelvic ascites is noted. IMPRESSION: No hydronephrosis. Foley catheter within decompressed urinary bladder. Abdominal ascites. Electronically Signed   By: Natasha Mead M.D.   On: 05/05/2015 17:40      Assessment & Plan: Pt is a 64 y.o. female with a PMHx of diabetes mellitus, liver cirrhosis,chronic kidney disease stage IV baseline creatinine 2.8, thyroid disease, bipolar disorder, GERD, recurrent urinary tract infections, chronic pancytopenia, who was admitted to [facility] on 05/06/2015 for ongoing treatment of acute respiratory failure, liver cirrhosis, acute renal failure.  1. Acute renal failure. 2. Chronic kidney disease stage IV baseline creatinine 2.8. 3. Metabolic acidosis. 4. Acute respiratory failure. 5. Liver cirrhosis with ascites.  Plan: The patient has very severe acute renal failure. When she first presented to [facility] creatinine was 2.8 and had risen to 3.3 with diuresis. There was some suspicion for underlying hepatorenal syndrome and she was provided octreotide for a period of time. Creatinine continues to rise now and vancomycin level was found to be elevated at 30 which may be contributing to the acute renal failure now. She was also on diuretic therapy which could have been contributing. Diuretics have now been held. We had a discussion with  the patient's daughter. She continues to desire aggressive care. Therefore we offered a course of renal replacement therapy which the patient's daughter was agreeable to. We will refer the patient to vascular interventional radiology for  temporary dialysis catheter placement. Thereafter we will proceed with hemodialysis today, tomorrow, and Wednesday. Further plan based upon her response to therapy now. Overall prognosis quite guarded.

## 2015-05-08 ENCOUNTER — Other Ambulatory Visit (HOSPITAL_COMMUNITY): Payer: Self-pay

## 2015-05-08 LAB — CBC
HCT: 25 % — ABNORMAL LOW (ref 36.0–46.0)
HCT: 26.2 % — ABNORMAL LOW (ref 36.0–46.0)
HEMOGLOBIN: 8.2 g/dL — AB (ref 12.0–15.0)
HEMOGLOBIN: 8.8 g/dL — AB (ref 12.0–15.0)
MCH: 31.3 pg (ref 26.0–34.0)
MCH: 31.8 pg (ref 26.0–34.0)
MCHC: 32.8 g/dL (ref 30.0–36.0)
MCHC: 33.6 g/dL (ref 30.0–36.0)
MCV: 95.4 fL (ref 78.0–100.0)
MCV: 94.6 fL (ref 78.0–100.0)
PLATELETS: 48 10*3/uL — AB (ref 150–400)
Platelets: 32 10*3/uL — ABNORMAL LOW (ref 150–400)
RBC: 2.62 MIL/uL — AB (ref 3.87–5.11)
RBC: 2.77 MIL/uL — AB (ref 3.87–5.11)
RDW: 23.1 % — ABNORMAL HIGH (ref 11.5–15.5)
RDW: 22.8 % — ABNORMAL HIGH (ref 11.5–15.5)
WBC: 3.8 10*3/uL — AB (ref 4.0–10.5)
WBC: 5.2 10*3/uL (ref 4.0–10.5)

## 2015-05-08 LAB — BLOOD GAS, ARTERIAL
Acid-base deficit: 7.6 mmol/L — ABNORMAL HIGH (ref 0.0–2.0)
BICARBONATE: 16.2 meq/L — AB (ref 20.0–24.0)
FIO2: 0.28
O2 SAT: 98.8 %
PEEP: 5 cmH2O
PH ART: 7.404 (ref 7.350–7.450)
Patient temperature: 98.6
RATE: 12 resp/min
TCO2: 17.1 mmol/L (ref 0–100)
VT: 500 mL
pCO2 arterial: 26.5 mmHg — ABNORMAL LOW (ref 35.0–45.0)
pO2, Arterial: 128 mmHg — ABNORMAL HIGH (ref 80.0–100.0)

## 2015-05-08 LAB — PROTIME-INR
INR: 1.73 — AB (ref 0.00–1.49)
INR: 1.97 — AB (ref 0.00–1.49)
Prothrombin Time: 20.3 seconds — ABNORMAL HIGH (ref 11.6–15.2)
Prothrombin Time: 22.3 seconds — ABNORMAL HIGH (ref 11.6–15.2)

## 2015-05-08 LAB — RENAL FUNCTION PANEL
ALBUMIN: 2.2 g/dL — AB (ref 3.5–5.0)
ANION GAP: 15 (ref 5–15)
BUN: 149 mg/dL — ABNORMAL HIGH (ref 6–20)
CALCIUM: 7.9 mg/dL — AB (ref 8.9–10.3)
CO2: 16 mmol/L — ABNORMAL LOW (ref 22–32)
CREATININE: 8.38 mg/dL — AB (ref 0.44–1.00)
Chloride: 118 mmol/L — ABNORMAL HIGH (ref 101–111)
GFR, EST AFRICAN AMERICAN: 5 mL/min — AB (ref >60)
GFR, EST NON AFRICAN AMERICAN: 4 mL/min — AB (ref >60)
Glucose, Bld: 244 mg/dL — ABNORMAL HIGH (ref 65–99)
PHOSPHORUS: 8.1 mg/dL — AB (ref 2.5–4.6)
Potassium: 3.9 mmol/L (ref 3.5–5.1)
SODIUM: 149 mmol/L — AB (ref 135–145)

## 2015-05-08 LAB — BASIC METABOLIC PANEL
ANION GAP: 15 (ref 5–15)
BUN: 97 mg/dL — ABNORMAL HIGH (ref 6–20)
CALCIUM: 8 mg/dL — AB (ref 8.9–10.3)
CHLORIDE: 110 mmol/L (ref 101–111)
CO2: 20 mmol/L — AB (ref 22–32)
CREATININE: 6.03 mg/dL — AB (ref 0.44–1.00)
GFR calc non Af Amer: 7 mL/min — ABNORMAL LOW (ref >60)
GFR, EST AFRICAN AMERICAN: 8 mL/min — AB (ref >60)
Glucose, Bld: 226 mg/dL — ABNORMAL HIGH (ref 65–99)
Potassium: 3.5 mmol/L (ref 3.5–5.1)
SODIUM: 145 mmol/L (ref 135–145)

## 2015-05-08 LAB — APTT: APTT: 52 s — AB (ref 24–37)

## 2015-05-08 MED ORDER — HEPARIN SODIUM (PORCINE) 1000 UNIT/ML IJ SOLN
INTRAMUSCULAR | Status: AC
  Filled 2015-05-08: qty 1

## 2015-05-08 MED ORDER — FENTANYL CITRATE (PF) 100 MCG/2ML IJ SOLN
INTRAMUSCULAR | Status: AC
  Filled 2015-05-08: qty 2

## 2015-05-08 MED ORDER — MIDAZOLAM HCL 2 MG/2ML IJ SOLN
INTRAMUSCULAR | Status: AC
  Filled 2015-05-08: qty 2

## 2015-05-08 MED ORDER — LIDOCAINE-EPINEPHRINE (PF) 1 %-1:200000 IJ SOLN
INTRAMUSCULAR | Status: AC
  Filled 2015-05-08: qty 30

## 2015-05-08 NOTE — Progress Notes (Signed)
Patient ID: Bethany [name], female   DOB: [birth year], 64 y.o.   MRN: [id number]   Request made for percutaneous gastric tube placement  CT Abd reveals moderate amount of ascites This does render pt NOT candidate for percutaneous gastric tube placement per Dr Grace IsaacWatts  Rec: consider paracentesis Re US Abd in 1-2 weeks---if no accumulation---may could reconsider for placement                                            If + accumulation----not a candidate

## 2015-05-08 NOTE — Sedation Documentation (Signed)
Patient is resting comfortably. 

## 2015-05-08 NOTE — Procedures (Signed)
Successful placement of non-tunneled HD catheter with tips terminating within the superior aspect of the right atrium.   The patient tolerated the procedure well without immediate post procedural complication.  The catheter is ready for immediate use.   Jay Annaleah Arata, MD Pager #: [phone]   

## 2015-05-08 NOTE — Sedation Documentation (Signed)
No sedation given. Pt tolerated procedure well. Respiratory at bedside

## 2015-05-09 ENCOUNTER — Other Ambulatory Visit (HOSPITAL_COMMUNITY): Payer: Self-pay

## 2015-05-09 LAB — CBC
HEMATOCRIT: 24.6 % — AB (ref 36.0–46.0)
HEMOGLOBIN: 8.1 g/dL — AB (ref 12.0–15.0)
MCH: 31.8 pg (ref 26.0–34.0)
MCHC: 32.9 g/dL (ref 30.0–36.0)
MCV: 96.5 fL (ref 78.0–100.0)
Platelets: 33 10*3/uL — ABNORMAL LOW (ref 150–400)
RBC: 2.55 MIL/uL — ABNORMAL LOW (ref 3.87–5.11)
RDW: 23 % — ABNORMAL HIGH (ref 11.5–15.5)
WBC: 3.4 10*3/uL — ABNORMAL LOW (ref 4.0–10.5)

## 2015-05-09 LAB — RENAL FUNCTION PANEL
ANION GAP: 15 (ref 5–15)
Albumin: 2.3 g/dL — ABNORMAL LOW (ref 3.5–5.0)
BUN: 107 mg/dL — ABNORMAL HIGH (ref 6–20)
CO2: 20 mmol/L — AB (ref 22–32)
Calcium: 7.9 mg/dL — ABNORMAL LOW (ref 8.9–10.3)
Chloride: 109 mmol/L (ref 101–111)
Creatinine, Ser: 6.59 mg/dL — ABNORMAL HIGH (ref 0.44–1.00)
GFR calc Af Amer: 7 mL/min — ABNORMAL LOW (ref >60)
GFR calc non Af Amer: 6 mL/min — ABNORMAL LOW (ref >60)
GLUCOSE: 250 mg/dL — AB (ref 65–99)
PHOSPHORUS: 7.3 mg/dL — AB (ref 2.5–4.6)
POTASSIUM: 3.6 mmol/L (ref 3.5–5.1)
Sodium: 144 mmol/L (ref 135–145)

## 2015-05-09 LAB — HEPATITIS B CORE ANTIBODY, TOTAL: HEP B C TOTAL AB: NEGATIVE

## 2015-05-09 LAB — HEPATITIS B SURFACE ANTIGEN: Hepatitis B Surface Ag: NEGATIVE

## 2015-05-09 NOTE — Progress Notes (Signed)
Subjective:  Patient remains critically ill Requiring ventilator support Continues to have massive edema and tense ascites BUN/Cr worse today UOP 50 cc  Objective:  Vital signs in last 24 hours:  Pulse Rate:  [83-87] 85 (12/27 1051) Resp:  [25-32] 32 (12/27 1051) BP: (154-169)/(56-65) 154/56 mmHg (12/27 1051) SpO2:  [97 %] 97 % (12/27 1051) FiO2 (%):  [100 %] 100 % (12/27 1010)  Weight change:  There were no vitals filed for this visit.  Intake/Output:   No intake or output data in the 24 hours ending 05/09/15 0651  98.8  77  18  154/62 Physical Exam: General: Critically ill appearing   HEENT ETT  Neck supple  Pulm/lungs Vent assisted. fio2 28 %  CVS/Heart Regular, no rub  Abdomen:  Distended from ascites  Extremities: ++  Edema b/l  Neurologic: Awake, follows few basic commands  Skin: No acute rashes  Access: Rt IJ temp  Cath VIR 12/27       Basic Metabolic Panel:   Recent Labs Lab 05/02/15 0708  05/03/15 1040 05/05/15 0603 05/07/15 0745 05/08/15 0917 05/08/15 1825 05/09/15 0600  NA  --   --  149* 147*  --  149* 145 144  K  --   --  4.1 4.1  --  3.9 3.5 3.6  CL  --   --  120* 119*  --  118* 110 109  CO2  --   --  16* 16*  --  16* 20* 20*  GLUCOSE  --   --  206* 289*  --  244* 226* 250*  BUN  --   --  116* 129*  --  149* 97* 107*  CREATININE  --   < > 4.97* 6.09* 7.56* 8.38* 6.03* 6.59*  CALCIUM 7.8*  < > 8.2* 8.1*  --  7.9* 8.0* 7.9*  MG 3.1*  --   --   --   --   --   --   --   PHOS 5.3*  --   --   --   --  8.1*  --  7.3*  < > = values in this interval not displayed.   CBC:  Recent Labs Lab 05/02/15 0708 05/03/15 1040 05/05/15 0603 05/08/15 0917 05/08/15 1825 05/09/15 0600  WBC 5.1 3.6* 6.2 3.8* 5.2 3.4*  NEUTROABS 4.1 2.9 5.3  --   --   --   HGB 9.3* 8.5* 9.1* 8.2* 8.8* 8.1*  HCT 28.9* 26.5* 28.7* 25.0* 26.2* 24.6*  MCV 96.3 96.0 97.3 95.4 94.6 96.5  PLT 55* 39* 58* 32* 48* PENDING      Microbiology:  No results found for this or  any previous visit (from the past 720 hour(s)).  Coagulation Studies:  Recent Labs  05/08/15 0800 05/08/15 1825  LABPROT 20.3* 22.3*  INR 1.73* 1.97*    Urinalysis: No results for input(s): COLORURINE, LABSPEC, PHURINE, GLUCOSEU, HGBUR, BILIRUBINUR, KETONESUR, PROTEINUR, UROBILINOGEN, NITRITE, LEUKOCYTESUR in the last 72 hours.  Invalid input(s): APPERANCEUR    Imaging: Ct Abdomen Wo Contrast  05/07/2015  CLINICAL DATA:  Evaluate anatomy for percutaneous gastrostomy tube placement. EXAM: CT ABDOMEN WITHOUT CONTRAST TECHNIQUE: Multidetector CT imaging of the abdomen was performed following the standard protocol without IV contrast. COMPARISON:  None. FINDINGS: Lower chest: Small LEFT pleural effusion and basilar atelectasis. Hepatobiliary: Moderate volume ascites surrounding the liver which has a shrunken contour. The caudate lobe is mildly enlarged Pancreas: Pancreas is normal. No ductal dilatation. No pancreatic inflammation. Spleen: Spleen is enlarged. There  are perisplenic venous collaterals difficult to evaluate on this noncontrast exam. Adrenals/urinary tract: Adrenal glands and kidneys are normal. Stomach/Bowel: NG tube extends into the stomach with tip projecting into the first portion the duodenum. Stomach is in typical orientation without mass or obstruction. The gastric body / antrum approaches the ventral peritoneal surface. Limited view of small bowel: Unremarkable. Vascular/Lymphatic: Abdominal aorta is normal. IVC is normal. No adenopathy. Venous collaterals in the LEFT periaortic location. Other: Moderate volume ascites. Musculoskeletal: No aggressive osseous lesion. IMPRESSION: 1. Stomach in typical orientation with the NG tube in first portion duodenum. 2. Large volume ascites with shrunken liver consistent with cirrhosis. Portal hypertension with splenomegaly and multiple venous collaterals. Electronically Signed   By: Suzy Bouchard M.D.   On: 05/07/2015 17:43   Ir Fluoro  Guide Cv Line Right  05/08/2015  INDICATION: End-stage renal disease. In need of intravenous access for the initiation of dialysis. EXAM: NON-TUNNELED CENTRAL VENOUS HEMODIALYSIS CATHETER PLACEMENT WITH ULTRASOUND AND FLUOROSCOPIC GUIDANCE COMPARISON:  None. MEDICATIONS: None CONTRAST:  None FLUOROSCOPY TIME:  30 seconds COMPLICATIONS: None immediate PROCEDURE: Informed written consent was obtained from the patient's family after a discussion of the risks, benefits, and alternatives to treatment. Questions regarding the procedure were encouraged and answered. The right neck and chest were prepped with chlorhexidine in a sterile fashion, and a sterile drape was applied covering the operative field. Maximum barrier sterile technique with sterile gowns and gloves were used for the procedure. A timeout was performed prior to the initiation of the procedure. After creating a small venotomy incision, a micropuncture kit was utilized to access the right internal jugular vein under direct, real-time ultrasound guidance after the overlying soft tissues were anesthetized with 1% lidocaine with epinephrine. Ultrasound image documentation was performed. The microwire was kinked to measure appropriate catheter length. A stiff glidewire was advanced to the level of the IVC. Under fluoroscopic guidance, the venotomy was serially dilated, ultimately allowing placement of a 20 cm temporary Trialysis catheter with tip ultimately terminating within the superior aspect of the right atrium. Final catheter positioning was confirmed and documented with a spot radiographic image. The catheter aspirates and flushes normally. The catheter was flushed with appropriate volume heparin dwells. The catheter exit site was secured with a 0-Prolene retention suture. A dressing was placed. The patient tolerated the procedure well without immediate post procedural complication. IMPRESSION: Successful placement of a right internal jugular approach 20  cm temporary dialysis catheter with tip terminating with in the superior aspect of the right atrium. The catheter is ready for immediate use. PLAN: This catheter may be converted to a tunneled dialysis catheter at a later date as indicated. Electronically Signed   By: Sandi Mariscal M.D.   On: 05/08/2015 11:31   Ir US Guide Vasc Access Right  05/08/2015  INDICATION: End-stage renal disease. In need of intravenous access for the initiation of dialysis. EXAM: NON-TUNNELED CENTRAL VENOUS HEMODIALYSIS CATHETER PLACEMENT WITH ULTRASOUND AND FLUOROSCOPIC GUIDANCE COMPARISON:  None. MEDICATIONS: None CONTRAST:  None FLUOROSCOPY TIME:  30 seconds COMPLICATIONS: None immediate PROCEDURE: Informed written consent was obtained from the patient's family after a discussion of the risks, benefits, and alternatives to treatment. Questions regarding the procedure were encouraged and answered. The right neck and chest were prepped with chlorhexidine in a sterile fashion, and a sterile drape was applied covering the operative field. Maximum barrier sterile technique with sterile gowns and gloves were used for the procedure. A timeout was performed prior to the initiation of the  procedure. After creating a small venotomy incision, a micropuncture kit was utilized to access the right internal jugular vein under direct, real-time ultrasound guidance after the overlying soft tissues were anesthetized with 1% lidocaine with epinephrine. Ultrasound image documentation was performed. The microwire was kinked to measure appropriate catheter length. A stiff glidewire was advanced to the level of the IVC. Under fluoroscopic guidance, the venotomy was serially dilated, ultimately allowing placement of a 20 cm temporary Trialysis catheter with tip ultimately terminating within the superior aspect of the right atrium. Final catheter positioning was confirmed and documented with a spot radiographic image. The catheter aspirates and flushes  normally. The catheter was flushed with appropriate volume heparin dwells. The catheter exit site was secured with a 0-Prolene retention suture. A dressing was placed. The patient tolerated the procedure well without immediate post procedural complication. IMPRESSION: Successful placement of a right internal jugular approach 20 cm temporary dialysis catheter with tip terminating with in the superior aspect of the right atrium. The catheter is ready for immediate use. PLAN: This catheter may be converted to a tunneled dialysis catheter at a later date as indicated. Electronically Signed   By: Sandi Mariscal M.D.   On: 05/08/2015 11:31     Medications:       Assessment/ Plan:  64 y.o. female with a PMHx of diabetes mellitus, liver cirrhosis,chronic kidney disease stage IV baseline creatinine 2.8, thyroid disease, bipolar disorder, GERD, recurrent urinary tract infections, chronic pancytopenia, who was admitted to [facility] on 05/09/2015 for ongoing treatment of acute respiratory failure, liver cirrhosis, acute renal failure.  1. Acute renal failure. ? Hepatorenal, vs Vanc toxicity, vs diuresis 2. Chronic kidney disease stage IV baseline creatinine 2.8. 3. Metabolic acidosis. 4. Acute respiratory failure. 5. Liver cirrhosis with ascites  Plan: HD today then Friday Will attempt some fluid removal with HD Consider Ascites tap D/c supplemental IV fluids Will follow    LOS:  Tauheed Mcfayden 12/28/20166:51 AM

## 2015-05-10 ENCOUNTER — Institutional Professional Consult (permissible substitution) (HOSPITAL_COMMUNITY): Payer: Self-pay

## 2015-05-10 LAB — CBC WITH DIFFERENTIAL/PLATELET
BASOS ABS: 0 10*3/uL (ref 0.0–0.1)
BASOS PCT: 0 %
EOS ABS: 0 10*3/uL (ref 0.0–0.7)
Eosinophils Relative: 0 %
HEMATOCRIT: 23.1 % — AB (ref 36.0–46.0)
HEMOGLOBIN: 7.5 g/dL — AB (ref 12.0–15.0)
Lymphocytes Relative: 13 %
Lymphs Abs: 0.4 10*3/uL — ABNORMAL LOW (ref 0.7–4.0)
MCH: 31.1 pg (ref 26.0–34.0)
MCHC: 32.5 g/dL (ref 30.0–36.0)
MCV: 95.9 fL (ref 78.0–100.0)
Monocytes Absolute: 0.5 10*3/uL (ref 0.1–1.0)
Monocytes Relative: 14 %
NEUTROS ABS: 2.4 10*3/uL (ref 1.7–7.7)
NEUTROS PCT: 72 %
Platelets: 26 10*3/uL — CL (ref 150–400)
RBC: 2.41 MIL/uL — AB (ref 3.87–5.11)
RDW: 22.4 % — ABNORMAL HIGH (ref 11.5–15.5)
WBC: 3.3 10*3/uL — AB (ref 4.0–10.5)

## 2015-05-10 LAB — RENAL FUNCTION PANEL
ALBUMIN: 2.2 g/dL — AB (ref 3.5–5.0)
ANION GAP: 12 (ref 5–15)
BUN: 82 mg/dL — AB (ref 6–20)
CO2: 25 mmol/L (ref 22–32)
Calcium: 7.8 mg/dL — ABNORMAL LOW (ref 8.9–10.3)
Chloride: 104 mmol/L (ref 101–111)
Creatinine, Ser: 5.52 mg/dL — ABNORMAL HIGH (ref 0.44–1.00)
GFR, EST AFRICAN AMERICAN: 9 mL/min — AB (ref >60)
GFR, EST NON AFRICAN AMERICAN: 7 mL/min — AB (ref >60)
GLUCOSE: 186 mg/dL — AB (ref 65–99)
PHOSPHORUS: 6.5 mg/dL — AB (ref 2.5–4.6)
Potassium: 3.2 mmol/L — ABNORMAL LOW (ref 3.5–5.1)
SODIUM: 141 mmol/L (ref 135–145)

## 2015-05-10 NOTE — Progress Notes (Signed)
PULMONARY / CRITICAL CARE MEDICINE   Name: Bethany [name] MRN: [id number] DOB: [birth year]    ADMISSION DATE:  05/11/2015 CONSULTATION DATE:  05/07/2015  REFERRING MD:  Sharyon MedicusHijazi  CHIEF COMPLAINT:  Short of breath  SUBJECTIVE:  Getting HD.  VITAL SIGNS: HR 86, T 98.1, BP 121/46, SpO2 100%, RR 18 AC 12, VT 500, FiO2 28%, PEEP 5  PHYSICAL EXAMINATION: General:  jaundice Neuro:  Alert, follows commands HEENT:  ETT in place Cardiovascular:  regular Lungs:  B/l rhonchi Abdomen:  Soft, mild distention Musculoskeletal:  1+ edema Skin:  Multiple areas of ecchymosis  LABS:  BMET  Recent Labs Lab 05/08/15 1825 05/09/15 0600 05/10/15 0824  NA 145 144 141  K 3.5 3.6 3.2*  CL 110 109 104  CO2 20* 20* 25  BUN 97* 107* 82*  CREATININE 6.03* 6.59* 5.52*  GLUCOSE 226* 250* 186*    Electrolytes  Recent Labs Lab 05/08/15 0917 05/08/15 1825 05/09/15 0600 05/10/15 0824  CALCIUM 7.9* 8.0* 7.9* 7.8*  PHOS 8.1*  --  7.3* 6.5*    CBC  Recent Labs Lab 05/08/15 1825 05/09/15 0600 05/10/15 0824  WBC 5.2 3.4* 3.3*  HGB 8.8* 8.1* 7.5*  HCT 26.2* 24.6* 23.1*  PLT 48* 33* 26*    Coag's  Recent Labs Lab 05/08/15 0800 05/08/15 1825  APTT  --  52*  INR 1.73* 1.97*    Sepsis Markers  Recent Labs Lab 05/05/15 1125  LATICACIDVEN 1.1    ABG  Recent Labs Lab 05/05/15 0616 05/06/15 0915 05/08/15 1501  PHART 7.260* 7.269* 7.404  PCO2ART 32.1* 32.1* 26.5*  PO2ART 114* 130* 128*    Liver Enzymes  Recent Labs Lab 05/05/15 0603 05/08/15 0917 05/09/15 0600 05/10/15 0824  AST 137*  --   --   --   ALT 55*  --   --   --   ALKPHOS 255*  --   --   --   BILITOT 7.0*  --   --   --   ALBUMIN 2.4* 2.2* 2.3* 2.2*    Imaging Dg Chest Port 1 View  05/10/2015  CLINICAL DATA:  Respiratory failure. EXAM: PORTABLE CHEST 1 VIEW COMPARISON:  03/04/2011. FINDINGS: Endotracheal tube tip projects 3.9 cm about the carina. Right internal jugular central venous line tip  projects in the right atrium. Orogastric tube passes below the diaphragm well into the stomach. Cardiac silhouette is normal in size and configuration. No mediastinal or hilar masses or convincing adenopathy. Lungs show no convincing pneumonia or pulmonary edema. No pleural effusion or pneumothorax. IMPRESSION: 1. No acute cardiopulmonary disease. 2. Support apparatus positioned as described. Electronically Signed   By: Amie Portlandavid  Ormond M.D.   On: 05/10/2015 08:20     STUDIES:  12/26 CT abd/pelvis >> ascites, cirrhosis, splenomegaly  SIGNIFICANT EVENTS: 12[place]20 [facility] [place] [facility]  LINES/TUBES: ETT >> 12/27 Rt IJ HD cath >>  DISCUSSION: 64 yo female with hypervolemia and respiratory failure in setting of Influenza B PNA, new onset seizures, ESRD and cirrhosis.  She also has hx of DM, Hypothyroidism, Bipolar disease.  ASSESSMENT / PLAN:  Acute respiratory failure. Plan: - pressure support wean as tolerated - primary team arranging for tracheostomy with ENT - f/u CXR intermittently  ESRD. Plan: - HD per renal  Cirrhosis. Chronic thrombocytopenia. Seizures. Plan: - per primary team   Coralyn HellingVineet Gianne Shugars, MD [facility] 05/10/2015, 11:01 AM Pager:  [phone] After 3pm call: [phone]

## 2015-05-11 LAB — RENAL FUNCTION PANEL
ANION GAP: 14 (ref 5–15)
Albumin: 2.6 g/dL — ABNORMAL LOW (ref 3.5–5.0)
BUN: 62 mg/dL — AB (ref 6–20)
CALCIUM: 8.2 mg/dL — AB (ref 8.9–10.3)
CO2: 24 mmol/L (ref 22–32)
CREATININE: 4.69 mg/dL — AB (ref 0.44–1.00)
Chloride: 101 mmol/L (ref 101–111)
GFR calc Af Amer: 10 mL/min — ABNORMAL LOW (ref >60)
GFR calc non Af Amer: 9 mL/min — ABNORMAL LOW (ref >60)
GLUCOSE: 159 mg/dL — AB (ref 65–99)
Phosphorus: 5.3 mg/dL — ABNORMAL HIGH (ref 2.5–4.6)
Potassium: 3.1 mmol/L — ABNORMAL LOW (ref 3.5–5.1)
SODIUM: 139 mmol/L (ref 135–145)

## 2015-05-11 LAB — CBC
HCT: 26.1 % — ABNORMAL LOW (ref 36.0–46.0)
Hemoglobin: 8.3 g/dL — ABNORMAL LOW (ref 12.0–15.0)
MCH: 31 pg (ref 26.0–34.0)
MCHC: 31.8 g/dL (ref 30.0–36.0)
MCV: 97.4 fL (ref 78.0–100.0)
Platelets: 37 K/uL — ABNORMAL LOW (ref 150–400)
RBC: 2.68 MIL/uL — ABNORMAL LOW (ref 3.87–5.11)
RDW: 22.3 % — ABNORMAL HIGH (ref 11.5–15.5)
WBC: 4.7 K/uL (ref 4.0–10.5)

## 2015-05-11 NOTE — Progress Notes (Addendum)
Subjective:  Patient remains critically ill Requiring ventilator support Continues to have massive edema and tense ascites UOP remains poor HD on 12/29 removed 2500 cc fluid  Objective:  Vital signs in last 24 hours:     Weight change:  There were no vitals filed for this visit.  Intake/Output:   No intake or output data in the 24 hours ending 05/11/15 1106  98.8 78  24  122/57 Physical Exam: General: Critically ill appearing   HEENT ETT, left eye conjunctival hemorrhage  Neck supple  Pulm/lungs Vent assisted. fio2 28 %  CVS/Heart Regular, no rub  Abdomen:  Distended from ascites  Extremities: ++  Edema b/l  Neurologic: Awake, follows few basic commands  Skin: No acute rashes  Access: Rt IJ temp  Cath VIR 12/27   Foley, rectal tube    Basic Metabolic Panel:   Recent Labs Lab 05/08/15 0917 05/08/15 1825 05/09/15 0600 05/10/15 0824 05/11/15 0855  NA 149* 145 144 141 139  K 3.9 3.5 3.6 3.2* 3.1*  CL 118* 110 109 104 101  CO2 16* 20* 20* 25 24  GLUCOSE 244* 226* 250* 186* 159*  BUN 149* 97* 107* 82* 62*  CREATININE 8.38* 6.03* 6.59* 5.52* 4.69*  CALCIUM 7.9* 8.0* 7.9* 7.8* 8.2*  PHOS 8.1*  --  7.3* 6.5* 5.3*     CBC:  Recent Labs Lab 05/05/15 0603 05/08/15 0917 05/08/15 1825 05/09/15 0600 05/10/15 0824 05/11/15 0835  WBC 6.2 3.8* 5.2 3.4* 3.3* 4.7  NEUTROABS 5.3  --   --   --  2.4  --   HGB 9.1* 8.2* 8.8* 8.1* 7.5* 8.3*  HCT 28.7* 25.0* 26.2* 24.6* 23.1* 26.1*  MCV 97.3 95.4 94.6 96.5 95.9 97.4  PLT 58* 32* 48* 33* 26* PENDING      Microbiology:  No results found for this or any previous visit (from the past 720 hour(s)).  Coagulation Studies:  Recent Labs  05/08/15 1825  LABPROT 22.3*  INR 1.97*    Urinalysis: No results for input(s): COLORURINE, LABSPEC, PHURINE, GLUCOSEU, HGBUR, BILIRUBINUR, KETONESUR, PROTEINUR, UROBILINOGEN, NITRITE, LEUKOCYTESUR in the last 72 hours.  Invalid input(s): APPERANCEUR    Imaging: Dg Chest  Port 1 View  05/10/2015  CLINICAL DATA:  Respiratory failure. EXAM: PORTABLE CHEST 1 VIEW COMPARISON:  03/04/2011. FINDINGS: Endotracheal tube tip projects 3.9 cm about the carina. Right internal jugular central venous line tip projects in the right atrium. Orogastric tube passes below the diaphragm well into the stomach. Cardiac silhouette is normal in size and configuration. No mediastinal or hilar masses or convincing adenopathy. Lungs show no convincing pneumonia or pulmonary edema. No pleural effusion or pneumothorax. IMPRESSION: 1. No acute cardiopulmonary disease. 2. Support apparatus positioned as described. Electronically Signed   By: Amie Portlandavid  Ormond M.D.   On: 05/10/2015 08:20     Medications:       Assessment/ Plan:  64 y.o. female with a PMHx of diabetes mellitus, liver cirrhosis,chronic kidney disease stage IV baseline creatinine 2.8, thyroid disease, bipolar disorder, GERD, recurrent urinary tract infections, chronic pancytopenia, who was admitted to [facility] on 04/25/2015 for ongoing treatment of acute respiratory failure, liver cirrhosis, acute renal failure.  1. Acute renal failure. ? Hepatorenal, vs Vanc toxicity, vs diuresis 2. Chronic kidney disease stage IV baseline creatinine 2.8. 3. Metabolic acidosis. 4. Acute respiratory failure. 5. Liver cirrhosis with ascites 6. Thrombocytopenia. Pack catheter with citrate  Plan: UOP remains poor. S Cr and BUN remain high. Patient continues to have fluid overload  HD TTS schedule Will attempt some fluid removal with HD D/c supplemental IV fluids Will follow    LOS:  Virgilia Quigg 12/30/201611:06 AM

## 2015-05-12 LAB — CBC
HEMATOCRIT: 24.4 % — AB (ref 36.0–46.0)
HEMOGLOBIN: 8 g/dL — AB (ref 12.0–15.0)
MCH: 31.9 pg (ref 26.0–34.0)
MCHC: 32.8 g/dL (ref 30.0–36.0)
MCV: 97.2 fL (ref 78.0–100.0)
Platelets: 39 10*3/uL — ABNORMAL LOW (ref 150–400)
RBC: 2.51 MIL/uL — AB (ref 3.87–5.11)
RDW: 21.9 % — ABNORMAL HIGH (ref 11.5–15.5)
WBC: 4.1 10*3/uL (ref 4.0–10.5)

## 2015-05-12 LAB — RENAL FUNCTION PANEL
Albumin: 2.3 g/dL — ABNORMAL LOW (ref 3.5–5.0)
Anion gap: 13 (ref 5–15)
BUN: 77 mg/dL — ABNORMAL HIGH (ref 6–20)
CO2: 25 mmol/L (ref 22–32)
Calcium: 8.3 mg/dL — ABNORMAL LOW (ref 8.9–10.3)
Chloride: 102 mmol/L (ref 101–111)
Creatinine, Ser: 5.55 mg/dL — ABNORMAL HIGH (ref 0.44–1.00)
GFR calc Af Amer: 9 mL/min — ABNORMAL LOW
GFR calc non Af Amer: 7 mL/min — ABNORMAL LOW
Glucose, Bld: 157 mg/dL — ABNORMAL HIGH (ref 65–99)
Phosphorus: 6.9 mg/dL — ABNORMAL HIGH (ref 2.5–4.6)
Potassium: 3.6 mmol/L (ref 3.5–5.1)
Sodium: 140 mmol/L (ref 135–145)

## 2015-05-13 DEATH — deceased

## 2015-05-14 ENCOUNTER — Other Ambulatory Visit (HOSPITAL_COMMUNITY): Payer: Self-pay

## 2015-05-14 NOTE — Progress Notes (Signed)
Subjective:  Patient has been evaluated for tracheostomy.  Hopefully this will be performed later this week. Patient continues on dialysis on Tuesday, Thursday, Saturday.  Objective:  Vital signs in last 24 hours:  Temperature 98.3 pulse 80 respirations 16 blood pressure 118/52 pulse ox 98%   Physical Exam: General: Critically ill appearing   HEENT ETT, left eye conjunctival hemorrhage  Neck supple  Pulm/lungs Vent assisted. fio2 28%  CVS/Heart Regular, no rub  Abdomen:  Distended from ascites  Extremities: 2+ bilateral lower extremity edema  Neurologic: Awake, alert, follows commands.  Skin: No acute rashes  Access: Rt IJ temp  Cath VIR 12/27   Foley, rectal tube    Basic Metabolic Panel:   Recent Labs Lab 05/08/15 0917 05/08/15 1825 05/09/15 0600 05/10/15 0824 05/11/15 0855 05/12/15 0743  NA 149* 145 144 141 139 140  K 3.9 3.5 3.6 3.2* 3.1* 3.6  CL 118* 110 109 104 101 102  CO2 16* 20* 20* 25 24 25   GLUCOSE 244* 226* 250* 186* 159* 157*  BUN 149* 97* 107* 82* 62* 77*  CREATININE 8.38* 6.03* 6.59* 5.52* 4.69* 5.55*  CALCIUM 7.9* 8.0* 7.9* 7.8* 8.2* 8.3*  PHOS 8.1*  --  7.3* 6.5* 5.3* 6.9*     CBC:  Recent Labs Lab 05/08/15 1825 05/09/15 0600 05/10/15 0824 05/11/15 0835 05/12/15 0743  WBC 5.2 3.4* 3.3* 4.7 4.1  NEUTROABS  --   --  2.4  --   --   HGB 8.8* 8.1* 7.5* 8.3* 8.0*  HCT 26.2* 24.6* 23.1* 26.1* 24.4*  MCV 94.6 96.5 95.9 97.4 97.2  PLT 48* 33* 26* 37* 39*      Microbiology:  No results found for this or any previous visit (from the past 720 hour(s)).  Coagulation Studies: No results for input(s): LABPROT, INR in the last 72 hours.  Urinalysis: No results for input(s): COLORURINE, LABSPEC, PHURINE, GLUCOSEU, HGBUR, BILIRUBINUR, KETONESUR, PROTEINUR, UROBILINOGEN, NITRITE, LEUKOCYTESUR in the last 72 hours.  Invalid input(s): APPERANCEUR    Imaging: Dg Chest Port 1 View  05/14/2015  CLINICAL DATA:  65 year old female with  respiratory failure and pneumonia. History of cirrhosis and renal failure. EXAM: PORTABLE CHEST 1 VIEW COMPARISON:  05/10/2015 and prior exams FINDINGS: Cardiomegaly and mild pulmonary vascular congestion again noted. A right central venous catheter tip overlying the right atrium, endotracheal tube with tip 4.3 cm above the carina, and NG tube entering the stomach with tip off the field of view again noted. There is no evidence of pneumothorax or pleural effusion. Mild peribronchial thickening is unchanged. IMPRESSION: Cardiomegaly with mild pulmonary vascular congestion. Electronically Signed   By: Harmon Pier M.D.   On: 05/14/2015 13:14     Medications:       Assessment/ Plan:  65 y.o. female with a PMHx of diabetes mellitus, liver cirrhosis,chronic kidney disease stage IV baseline creatinine 2.8, thyroid disease, bipolar disorder, GERD, recurrent urinary tract infections, chronic pancytopenia, who was admitted to [facility] on 04/15/2015 for ongoing treatment of acute respiratory failure, liver cirrhosis, acute renal failure.  1. Acute renal failure. ? Hepatorenal, vs Vanc toxicity, vs diuresis 2. Chronic kidney disease stage IV baseline creatinine 2.8. 3. Metabolic acidosis. 4. Acute respiratory failure. 5. Liver cirrhosis with ascites 6. Thrombocytopenia. Pack catheter with citrate  Plan: Overall the patient remains critically ill. She remains oligoanuric at this point in time.therefore we will continue hemodialysis on a Tuesday, Thursday, Saturday schedule.  We will continue to pack catheter with citrate given thrombocytopenia over  thrombocytopenia is likely related to her underlying cirrhosis of the liver. She may end up becoming dialysis dependent. We will prepare for dialysis orders for tomorrow.    LOS:  Bethany [name] 1/2/20174:53 PM

## 2015-05-14 NOTE — Consult Note (Unsigned)
NAMHaynes [name]:  [name], Bethany                 ACCOUNT NO.:  [id number]  MEDICAL RECORD NO.:  [id number]  LOCATION:                                 FACILITY:  PHYSICIAN:  Kristine GarbeChristopher E. [name], M.D. DATE OF BIRTH:  DATE OF CONSULTATION:  05/11/2015 DATE OF DISCHARGE:                                CONSULTATION   REASON FOR CONSULT:  Evaluate the patient for tracheostomy.  BRIEF HISTORY:  Bethany KettleBerl [name] is a 65 year old female, who was [facility] in [place] with history of cirrhosis and acute kidney disease.  During hospitalization, she developed respiratory failure secondary to pneumonia and required intubation.  [facility] was subsequently [facility], [facility], for management of her acute respiratory failure, renal failure, and chronic liver cirrhosis.  The patient has been intubated on ventilator, [facility] with no immediate plan for extubation.  I was asked to consider tracheostomy on the patient.  On exam, the patient was intubated and unresponsive.  On examination of the neck, the trach is midline with no significant neck masses, moderate size neck.  She had elevated INR of 1.9 secondary to cirrhosis.  IMPRESSION:  Acute on chronic respiratory failure.  PLAN:  We will plan on scheduling the patient for tracheotomy next Monday or Tuesday when I [facility].          ______________________________ Kristine Garbehristopher E. [name], M.D.     CEN/MEDQ  D:  05/11/2015  T:  05/11/2015  Job:  [id number]

## 2015-05-15 ENCOUNTER — Encounter (HOSPITAL_COMMUNITY): Payer: Self-pay | Admitting: Anesthesiology

## 2015-05-15 ENCOUNTER — Encounter: Admission: AD | Disposition: E | Payer: Self-pay | Source: Ambulatory Visit | Attending: Internal Medicine

## 2015-05-15 HISTORY — PX: TRACHEOSTOMY TUBE PLACEMENT: SHX814

## 2015-05-15 LAB — RENAL FUNCTION PANEL
ANION GAP: 15 (ref 5–15)
Albumin: 2.3 g/dL — ABNORMAL LOW (ref 3.5–5.0)
BUN: 98 mg/dL — ABNORMAL HIGH (ref 6–20)
CHLORIDE: 100 mmol/L — AB (ref 101–111)
CO2: 23 mmol/L (ref 22–32)
Calcium: 8 mg/dL — ABNORMAL LOW (ref 8.9–10.3)
Creatinine, Ser: 7.62 mg/dL — ABNORMAL HIGH (ref 0.44–1.00)
GFR calc non Af Amer: 5 mL/min — ABNORMAL LOW (ref >60)
GFR, EST AFRICAN AMERICAN: 6 mL/min — AB (ref >60)
GLUCOSE: 127 mg/dL — AB (ref 65–99)
POTASSIUM: 3.1 mmol/L — AB (ref 3.5–5.1)
Phosphorus: 8 mg/dL — ABNORMAL HIGH (ref 2.5–4.6)
Sodium: 138 mmol/L (ref 135–145)

## 2015-05-15 LAB — CBC
HCT: 21.3 % — ABNORMAL LOW (ref 36.0–46.0)
HEMOGLOBIN: 7.2 g/dL — AB (ref 12.0–15.0)
MCH: 32.4 pg (ref 26.0–34.0)
MCHC: 33.8 g/dL (ref 30.0–36.0)
MCV: 95.9 fL (ref 78.0–100.0)
Platelets: 42 10*3/uL — ABNORMAL LOW (ref 150–400)
RBC: 2.22 MIL/uL — AB (ref 3.87–5.11)
RDW: 22.2 % — ABNORMAL HIGH (ref 11.5–15.5)
WBC: 3.9 10*3/uL — ABNORMAL LOW (ref 4.0–10.5)

## 2015-05-15 SURGERY — CREATION, TRACHEOSTOMY
Anesthesia: General | Site: Neck

## 2015-05-15 MED ORDER — SUCCINYLCHOLINE CHLORIDE 20 MG/ML IJ SOLN
INTRAMUSCULAR | Status: AC
  Filled 2015-05-15: qty 2

## 2015-05-15 MED ORDER — LIDOCAINE HCL (CARDIAC) 20 MG/ML IV SOLN
INTRAVENOUS | Status: AC
  Filled 2015-05-15: qty 5

## 2015-05-15 MED ORDER — MIDAZOLAM HCL 2 MG/2ML IJ SOLN
INTRAMUSCULAR | Status: DC | PRN
  Administered 2015-05-15 (×2): 2 mg via INTRAVENOUS

## 2015-05-15 MED ORDER — VANCOMYCIN HCL 1000 MG IV SOLR
1000.0000 mg | Freq: Once | INTRAVENOUS | Status: DC
  Filled 2015-05-15: qty 1000

## 2015-05-15 MED ORDER — LIDOCAINE-EPINEPHRINE 1 %-1:100000 IJ SOLN
INTRAMUSCULAR | Status: DC | PRN
  Administered 2015-05-15: 5 mL

## 2015-05-15 MED ORDER — PHENYLEPHRINE 40 MCG/ML (10ML) SYRINGE FOR IV PUSH (FOR BLOOD PRESSURE SUPPORT)
PREFILLED_SYRINGE | INTRAVENOUS | Status: AC
  Filled 2015-05-15: qty 10

## 2015-05-15 MED ORDER — ROCURONIUM BROMIDE 50 MG/5ML IV SOLN
INTRAVENOUS | Status: AC
  Filled 2015-05-15: qty 2

## 2015-05-15 MED ORDER — 0.9 % SODIUM CHLORIDE (POUR BTL) OPTIME
TOPICAL | Status: DC | PRN
  Administered 2015-05-15: 1000 mL

## 2015-05-15 MED ORDER — MIDAZOLAM HCL 2 MG/2ML IJ SOLN
INTRAMUSCULAR | Status: AC
  Filled 2015-05-15: qty 2

## 2015-05-15 MED ORDER — SUCCINYLCHOLINE CHLORIDE 20 MG/ML IJ SOLN
INTRAMUSCULAR | Status: AC
  Filled 2015-05-15: qty 1

## 2015-05-15 MED ORDER — ROCURONIUM BROMIDE 100 MG/10ML IV SOLN
INTRAVENOUS | Status: DC | PRN
  Administered 2015-05-15 (×2): 20 mg via INTRAVENOUS

## 2015-05-15 MED ORDER — FENTANYL CITRATE (PF) 250 MCG/5ML IJ SOLN
INTRAMUSCULAR | Status: AC
  Filled 2015-05-15: qty 5

## 2015-05-15 MED ORDER — SODIUM CHLORIDE 0.9 % IV SOLN
INTRAVENOUS | Status: DC | PRN
  Administered 2015-05-15: 08:00:00 via INTRAVENOUS

## 2015-05-15 MED ORDER — VANCOMYCIN HCL 1000 MG IV SOLR
1000.0000 mg | INTRAVENOUS | Status: DC | PRN
  Administered 2015-05-15: 1000 mg via INTRAVENOUS

## 2015-05-15 MED ORDER — PHENYLEPHRINE HCL 10 MG/ML IJ SOLN
INTRAMUSCULAR | Status: DC | PRN
  Administered 2015-05-15: 80 ug via INTRAVENOUS

## 2015-05-15 MED ORDER — ROCURONIUM BROMIDE 50 MG/5ML IV SOLN
INTRAVENOUS | Status: AC
  Filled 2015-05-15: qty 1

## 2015-05-15 MED ORDER — PROPOFOL 10 MG/ML IV BOLUS
INTRAVENOUS | Status: DC | PRN
  Administered 2015-05-15: 40 mg via INTRAVENOUS

## 2015-05-15 MED ORDER — FENTANYL CITRATE (PF) 250 MCG/5ML IJ SOLN
INTRAMUSCULAR | Status: DC | PRN
  Administered 2015-05-15: 100 ug via INTRAVENOUS

## 2015-05-15 MED ORDER — PROPOFOL 10 MG/ML IV BOLUS
INTRAVENOUS | Status: AC
  Filled 2015-05-15: qty 20

## 2015-05-15 SURGICAL SUPPLY — 38 items
ATTRACTOMAT 16X20 MAGNETIC DRP (DRAPES) IMPLANT
BLADE 10 SAFETY STRL DISP (BLADE) ×3 IMPLANT
BLADE SURG 15 STRL LF DISP TIS (BLADE) ×1 IMPLANT
BLADE SURG 15 STRL SS (BLADE) ×2
CLEANER TIP ELECTROSURG 2X2 (MISCELLANEOUS) ×3 IMPLANT
COVER SURGICAL LIGHT HANDLE (MISCELLANEOUS) ×3 IMPLANT
DRAPE PROXIMA HALF (DRAPES) ×3 IMPLANT
ELECT COATED BLADE 2.86 ST (ELECTRODE) ×3 IMPLANT
ELECT REM PT RETURN 9FT ADLT (ELECTROSURGICAL) ×3
ELECTRODE REM PT RTRN 9FT ADLT (ELECTROSURGICAL) ×1 IMPLANT
GAUZE SPONGE 4X4 16PLY XRAY LF (GAUZE/BANDAGES/DRESSINGS) ×3 IMPLANT
GLOVE SS BIOGEL STRL SZ 7.5 (GLOVE) ×2 IMPLANT
GLOVE SUPERSENSE BIOGEL SZ 7.5 (GLOVE) ×4
GOWN STRL REUS W/ TWL LRG LVL3 (GOWN DISPOSABLE) ×1 IMPLANT
GOWN STRL REUS W/ TWL XL LVL3 (GOWN DISPOSABLE) ×1 IMPLANT
GOWN STRL REUS W/TWL LRG LVL3 (GOWN DISPOSABLE) ×2
GOWN STRL REUS W/TWL XL LVL3 (GOWN DISPOSABLE) ×2
HOLDER TRACH TUBE VELCRO 19.5 (MISCELLANEOUS) ×3 IMPLANT
KIT BASIN OR (CUSTOM PROCEDURE TRAY) ×3 IMPLANT
KIT ROOM TURNOVER OR (KITS) ×3 IMPLANT
KIT SUCTION CATH 14FR (SUCTIONS) ×3 IMPLANT
NEEDLE HYPO 25GX1X1/2 BEV (NEEDLE) ×3 IMPLANT
NEEDLE HYPO 25X1 1.5 SAFETY (NEEDLE) IMPLANT
NS IRRIG 1000ML POUR BTL (IV SOLUTION) ×3 IMPLANT
PACK EENT II TURBAN DRAPE (CUSTOM PROCEDURE TRAY) ×3 IMPLANT
PAD ARMBOARD 7.5X6 YLW CONV (MISCELLANEOUS) ×6 IMPLANT
PENCIL BUTTON HOLSTER BLD 10FT (ELECTRODE) ×3 IMPLANT
SPONGE DRAIN TRACH 4X4 STRL 2S (GAUZE/BANDAGES/DRESSINGS) ×3 IMPLANT
SPONGE INTESTINAL PEANUT (DISPOSABLE) ×3 IMPLANT
SUT SILK 2 0 SH CR/8 (SUTURE) ×3 IMPLANT
SUT SILK 3 0 TIES 10X30 (SUTURE) IMPLANT
SYR 20CC LL (SYRINGE) ×3 IMPLANT
SYR CONTROL 10ML LL (SYRINGE) ×3 IMPLANT
TUBE CONNECTING 12'X1/4 (SUCTIONS) ×1
TUBE CONNECTING 12X1/4 (SUCTIONS) ×2 IMPLANT
TUBE TRACH SHILEY  6 DIST  CUF (TUBING) ×3 IMPLANT
TUBE TRACH SHILEY 10 DIST CUFF (TUBING) IMPLANT
TUBE TRACH SHILEY 8 DIST CUF (TUBING) IMPLANT

## 2015-05-15 NOTE — Brief Op Note (Signed)
05/09/2015 - 06/03/2015  8:27 AM  PATIENT:  Bethany [name]  65 y.o. female  PRE-OPERATIVE DIAGNOSIS:  acute respiratory failure  POST-OPERATIVE DIAGNOSIS:  same  PROCEDURE:  Procedure(s): TRACHEOSTOMY (N/A)  # 6 [place]  SURGEON:  Surgeon(s) and Role:    * Drema Halonhristopher E Calbert Hulsebus, MD - Primary  PHYSICIAN ASSISTANT:   ASSISTANTS: none   ANESTHESIA:   general  EBL:     BLOOD ADMINISTERED:none  DRAINS: none   LOCAL MEDICATIONS USED:  LIDOCAINE with EPI 5 cc  SPECIMEN:  No Specimen  DISPOSITION OF SPECIMEN:  N/A  COUNTS:  YES  TOURNIQUET:  * No tourniquets in log *  DICTATION: .Other Dictation: Dictation Number [id number]  PLAN OF CARE: Discharge to home after PACU  PATIENT DISPOSITION:  PACU - hemodynamically stable.   Delay start of Pharmacological VTE agent (>24hrs) due to surgical blood loss or risk of bleeding: not applicable

## 2015-05-15 NOTE — H&P (Signed)
The patient has been re-examined, and the chart reviewed, and there have been no interval changes to the documented history and physical.    The risks, benefits, and alternatives have been discussed at length, and the patient is willing to proceed.   

## 2015-05-15 NOTE — Progress Notes (Signed)
PULMONARY / CRITICAL CARE MEDICINE   Name: Bethany [name] MRN: [id number] DOB: 08/21/1950    ADMISSION DATE:  2014/09/19 CONSULTATION DATE:  05/07/2015  REFERRING MD:  Sharyon MedicusHijazi  CHIEF COMPLAINT:  Short of breath  SUBJECTIVE:  Post op trach  VITAL SIGNS: Vital signs reviewed. Abnormal values will appear under impression plan section.    PHYSICAL EXAMINATION: General:  jaundice Neuro:  Sedated HEENT: New trach in palce Cardiovascular:  regular Lungs:  B/l rhonchi Abdomen:  Soft, mild distention Musculoskeletal:  1+ edema Skin:  Multiple areas of ecchymosis  LABS:  BMET  Recent Labs Lab 05/11/15 0855 05/12/15 0743 05/28/2015 0625  NA 139 140 138  K 3.1* 3.6 3.1*  CL 101 102 100*  CO2 24 25 23   BUN 62* 77* 98*  CREATININE 4.69* 5.55* 7.62*  GLUCOSE 159* 157* 127*    Electrolytes  Recent Labs Lab 05/11/15 0855 05/12/15 0743 05/13/2015 0625  CALCIUM 8.2* 8.3* 8.0*  PHOS 5.3* 6.9* 8.0*    CBC  Recent Labs Lab 05/11/15 0835 05/12/15 0743 05/22/2015 0625  WBC 4.7 4.1 3.9*  HGB 8.3* 8.0* 7.2*  HCT 26.1* 24.4* 21.3*  PLT 37* 39* PENDING    Coag's  Recent Labs Lab 05/08/15 1825  APTT 52*  INR 1.97*    Sepsis Markers No results for input(s): LATICACIDVEN, PROCALCITON, O2SATVEN in the last 168 hours.  ABG  Recent Labs Lab 05/08/15 1501  PHART 7.404  PCO2ART 26.5*  PO2ART 128*    Liver Enzymes  Recent Labs Lab 05/11/15 0855 05/12/15 0743 06/04/2015 0625  ALBUMIN 2.6* 2.3* 2.3*    Imaging Dg Chest Port 1 View  05/14/2015  CLINICAL DATA:  65 year old female with respiratory failure and pneumonia. History of cirrhosis and renal failure. EXAM: PORTABLE CHEST 1 VIEW COMPARISON:  05/10/2015 and prior exams FINDINGS: Cardiomegaly and mild pulmonary vascular congestion again noted. A right central venous catheter tip overlying the right atrium, endotracheal tube with tip 4.3 cm above the carina, and NG tube entering the stomach with tip off the  field of view again noted. There is no evidence of pneumothorax or pleural effusion. Mild peribronchial thickening is unchanged. IMPRESSION: Cardiomegaly with mild pulmonary vascular congestion. Electronically Signed   By: Harmon PierJeffrey  Hu M.D.   On: 05/14/2015 13:14     STUDIES:  12/26 CT abd/pelvis >> ascites, cirrhosis, splenomegaly  SIGNIFICANT EVENTS: [place] [facility] ENT [name]  LINES/TUBES: ETT >>1/3 Trach ENT 1/3>> 12/27 Rt IJ HD cath >>  DISCUSSION: 65 yo female with hypervolemia and respiratory failure in setting of Influenza B PNA, new onset seizures, ESRD and cirrhosis.  She also has hx of DM, Hypothyroidism, Bipolar disease.  ASSESSMENT / PLAN:  Acute respiratory failure. Plan: - pressure support wean as tolerated - 06/11/2015 trach placed per ENT - f/u CXR intermittently  ESRD. Plan: - HD per renal  Cirrhosis. Chronic thrombocytopenia. Seizures. Plan: - per primary team   Brett CanalesSteve Oneal Schoenberger ACNP [facility] Pager [phone] till 3 pm If no answer page [phone] 06/06/2015, 8:35 AM

## 2015-05-15 NOTE — Anesthesia Postprocedure Evaluation (Signed)
Anesthesia Post Note  Patient: Bethany [name]  Procedure(s) Performed: Procedure(s) (LRB): TRACHEOSTOMY (N/A)  Patient location during evaluation: PACU Anesthesia Type: General Level of consciousness: awake and alert Pain management: pain level controlled Vital Signs Assessment: post-procedure vital signs reviewed and stable Respiratory status: spontaneous breathing, nonlabored ventilation, respiratory function stable and patient connected to nasal cannula oxygen Cardiovascular status: blood pressure returned to baseline and stable Postop Assessment: no signs of nausea or vomiting Anesthetic complications: no    Last Vitals:  Filed Vitals:   05/08/15 1046 05/08/15 1051  BP: 166/65 154/56  Pulse: 87 85  Resp: 25 32    Last Pain: There were no vitals filed for this visit.               Madline Oesterling L

## 2015-05-15 NOTE — Anesthesia Procedure Notes (Signed)
Date/Time: 05/18/2015 7:45 AM Performed by: Jerilee HohMUMM, Bettylou Frew N Pre-anesthesia Checklist: Patient identified, Emergency Drugs available, Suction available and Patient being monitored Patient Re-evaluated:Patient Re-evaluated prior to inductionOxygen Delivery Method: Circle system utilized Intubation Type: Inhalational induction with existing ETT Placement Confirmation: positive ETCO2 and breath sounds checked- equal and bilateral Tube secured with: Tape

## 2015-05-15 NOTE — Transfer of Care (Signed)
Immediate Anesthesia Transfer of Care Note  Patient: Bethany [name]  Procedure(s) Performed: Procedure(s): TRACHEOSTOMY (N/A)  Patient Location: ICU  Anesthesia Type:General  Level of Consciousness: sedated and unresponsive  Airway & Oxygen Therapy: Patient remains intubated per anesthesia plan and Patient placed on Ventilator (see vital sign flow sheet for setting)  Post-op Assessment: Report given to RN and Post -op Vital signs reviewed and stable  Post vital signs: Reviewed and stable  Last Vitals:  Filed Vitals:   05/08/15 1046 05/08/15 1051  BP: 166/65 154/56  Pulse: 87 85  Resp: 25 32    Complications: No apparent anesthesia complications

## 2015-05-15 NOTE — Anesthesia Preprocedure Evaluation (Addendum)
Anesthesia Evaluation  Patient identified by MRN, date of birth, ID band Patient awake    Reviewed: Allergy & Precautions, H&P , NPO status , Patient's Chart, lab work & pertinent test results  Airway Mallampati: Trach  TM Distance: >3 FB Neck ROM: full    Dental no notable dental hx. (+) Dental Advisory Given   Pulmonary pneumonia,  resp failure and trach   Pulmonary exam normal breath sounds clear to auscultation+ rhonchi        Cardiovascular Exercise Tolerance: Good negative cardio ROS Normal cardiovascular exam Rhythm:regular Rate:Normal     Neuro/Psych negative neurological ROS  negative psych ROS   GI/Hepatic negative GI ROS, (+) Cirrhosis       , Hepatitis -  Endo/Other  diabetes, Type 2, Insulin DependentMorbid obesity  Renal/GU CRFRenal diseaseCRT 5.5 BUN 77  negative genitourinary   Musculoskeletal   Abdominal   Peds  Hematology negative hematology ROS (+) anemia , hgb 8. Platelets 39   Anesthesia Other Findings   Reproductive/Obstetrics negative OB ROS                             Anesthesia Physical Anesthesia Plan  ASA: IV  Anesthesia Plan: General   Post-op Pain Management:    Induction: Intravenous  Airway Management Planned: Tracheostomy  Additional Equipment:   Intra-op Plan:   Post-operative Plan:   Informed Consent: I have reviewed the patients History and Physical, chart, labs and discussed the procedure including the risks, benefits and alternatives for the proposed anesthesia with the patient or authorized representative who has indicated his/her understanding and acceptance.   Dental Advisory Given  Plan Discussed with: CRNA and Surgeon  Anesthesia Plan Comments:         Anesthesia Quick Evaluation

## 2015-05-15 NOTE — Op Note (Signed)
NAMHaynes [name]:  Ferrufino, Kathleena                 ACCOUNT NO.:  [id number]  MEDICAL RECORD NO.:  [id number]  LOCATION:  [facility]                         FACILITY:  [facility]  PHYSICIAN:  Kristine GarbeChristopher E. [name], M.D.DATE OF BIRTH:  [birth year]  DATE OF PROCEDURE:  06/10/2015 DATE OF DISCHARGE:                              OPERATIVE REPORT   PREOPERATIVE DIAGNOSIS:  Acute respiratory failure.  POSTOPERATIVE DIAGNOSIS:  Acute respiratory failure.  OPERATION PERFORMED:  Tracheostomy with a #6 Shiley cuffed trach tube.  SURGEON:  Kristine GarbeChristopher E. [name], M.D.  ANESTHESIA:  General endotracheal.  COMPLICATIONS:  None.  BRIEF CLINICAL NOTE:  Bethany HoehnVerl Maffia is a 65 year old female who has had a recent new onset of a seizure disorder and encephalopathy.  She was intubated at an outside hospital a little over 2 weeks ago, was subsequently transferred to [facility] for long-term care.  She was initially intubated because of pneumonia and respiratory problems, but since transferred to [facility], I have been unable to wean her off the ventilator and she is intubated on a ventilator for over 2 weeks.  It is recommended that she undergo a tracheostomy and family is agreeable to this.  The patient is intubated and really nonresponsive.  DESCRIPTION OF PROCEDURE:  [facility]  Consent had been obtained with the family over the phone.  The patient was placed in supine position and the neck extended.  The proposed area of the incision for the tracheostomy was injected with 5 mL of Xylocaine with epinephrine for hemostasis.  A vertical incision was made midline just inferior to the cricoid cartilage of the trachea.  Dissection was carried down through the subcutaneous tissue.  Strap muscles were divided in the midline vertically and retracted laterally.  The thyroid isthmus was identified overlying the first couple of tracheal rings,  this was divided with cautery with minimal bleeding.  The first 3 tracheal rings were identified below the cricoid cartilage and a horizontal incision was made between the first and second tracheal rings.  Endotracheal tube was withdrawn and a #6 Shiley was inserted without any difficulty.  The patient was ventilated well with the 6 Shiley in place.  This was secured to the skin with 2-0 silk sutures x4 and a Velcro trach tape around the neck.  This completed the procedure.  There was minimal bleeding.  [facility] completion of the tracheostomy.          ______________________________ Kristine Garbehristopher E. [name], M.D.     CEN/MEDQ  D:  05/21/2015  T:  06/05/2015  Job:  [id number]  cc:   Dr. Allene PyoNewman's office

## 2015-05-16 ENCOUNTER — Encounter (HOSPITAL_COMMUNITY): Payer: Self-pay | Admitting: Otolaryngology

## 2015-05-16 NOTE — Progress Notes (Signed)
  Subjective:  Tracheostomy has now been placed. Patient had dialysis yesterday. She is due for dialysis again tomorrow. Has persistent acute renal failure and remains oliguric.  Objective:  Vital signs in last 24 hours:  Temperature 98.3 pulse 80 respirations 16 blood pressure 118/52 pulse ox 98%   Physical Exam: General: Critically ill appearing   HEENT Eyes open, NG in place  Neck Trach in place  Pulm/lungs Bilateral rhonchi, vent assisted  CVS/Heart Regular, no rub  Abdomen:  Distended from ascites  Extremities: 2+ bilateral lower extremity edema  Neurologic: Awake, alert, follows commands.  Skin: No acute rashes  Access: Rt IJ temp  Cath VIR 12/27   Foley, rectal tube    Basic Metabolic Panel:   Recent Labs Lab 05/10/15 0824 05/11/15 0855 05/12/15 0743 05/13/2015 0625  NA 141 139 140 138  K 3.2* 3.1* 3.6 3.1*  CL 104 101 102 100*  CO2 25 24 25 23   GLUCOSE 186* 159* 157* 127*  BUN 82* 62* 77* 98*  CREATININE 5.52* 4.69* 5.55* 7.62*  CALCIUM 7.8* 8.2* 8.3* 8.0*  PHOS 6.5* 5.3* 6.9* 8.0*     CBC:  Recent Labs Lab 05/10/15 0824 05/11/15 0835 05/12/15 0743 06/05/2015 0625  WBC 3.3* 4.7 4.1 3.9*  NEUTROABS 2.4  --   --   --   HGB 7.5* 8.3* 8.0* 7.2*  HCT 23.1* 26.1* 24.4* 21.3*  MCV 95.9 97.4 97.2 95.9  PLT 26* 37* 39* 42*      Microbiology:  No results found for this or any previous visit (from the past 720 hour(s)).  Coagulation Studies: No results for input(s): LABPROT, INR in the last 72 hours.  Urinalysis: No results for input(s): COLORURINE, LABSPEC, PHURINE, GLUCOSEU, HGBUR, BILIRUBINUR, KETONESUR, PROTEINUR, UROBILINOGEN, NITRITE, LEUKOCYTESUR in the last 72 hours.  Invalid input(s): APPERANCEUR    Imaging: No results found.   Medications:       Assessment/ Plan:  65 y.o. female with a PMHx of diabetes mellitus, liver cirrhosis,chronic kidney disease stage IV baseline creatinine 2.8, thyroid disease, bipolar disorder, GERD,  recurrent urinary tract infections, chronic pancytopenia, who was admitted to [facility] on 04/21/2015 for ongoing treatment of acute respiratory failure, liver cirrhosis, acute renal failure.  1. Acute renal failure. ? Hepatorenal, vs Vanc toxicity, vs diuresis 2. Chronic kidney disease stage IV baseline creatinine 2.8. 3. Metabolic acidosis. 4. Acute respiratory failure. 5. Liver cirrhosis with ascites 6. Thrombocytopenia. Pack catheter with citrate 7.  Anemia of CKD.  Plan: Erasmo LeventhalOligoria unfortunately persists. She will need ongoing dialysis at this point in time. We will plan for dialysis tomorrow. Ultrafiltration target will be 1.5-2 kg.hemoglobin also remained low. We will start the patient on Aranesp per protocol. Continue pack catheter with citrate. Suspect thrombocytopenia secondary to liver disease however. Patient now has tracheostomy in place. Weaning per respiratory medicine as well as hospitalist.  Switch pt to MWF schedule starting Friday.    LOS:  Chau Savell 1/4/20173:06 PM

## 2015-05-17 DIAGNOSIS — J9620 Acute and chronic respiratory failure, unspecified whether with hypoxia or hypercapnia: Secondary | ICD-10-CM

## 2015-05-17 LAB — RENAL FUNCTION PANEL
ALBUMIN: 2 g/dL — AB (ref 3.5–5.0)
Anion gap: 13 (ref 5–15)
BUN: 69 mg/dL — AB (ref 6–20)
CO2: 22 mmol/L (ref 22–32)
CREATININE: 5.8 mg/dL — AB (ref 0.44–1.00)
Calcium: 8.1 mg/dL — ABNORMAL LOW (ref 8.9–10.3)
Chloride: 98 mmol/L — ABNORMAL LOW (ref 101–111)
GFR calc Af Amer: 8 mL/min — ABNORMAL LOW (ref >60)
GFR, EST NON AFRICAN AMERICAN: 7 mL/min — AB (ref >60)
GLUCOSE: 207 mg/dL — AB (ref 65–99)
PHOSPHORUS: 6.2 mg/dL — AB (ref 2.5–4.6)
POTASSIUM: 3.2 mmol/L — AB (ref 3.5–5.1)
SODIUM: 133 mmol/L — AB (ref 135–145)

## 2015-05-17 LAB — CBC
HEMATOCRIT: 22 % — AB (ref 36.0–46.0)
Hemoglobin: 7.2 g/dL — ABNORMAL LOW (ref 12.0–15.0)
MCH: 32.4 pg (ref 26.0–34.0)
MCHC: 32.7 g/dL (ref 30.0–36.0)
MCV: 99.1 fL (ref 78.0–100.0)
PLATELETS: 52 10*3/uL — AB (ref 150–400)
RBC: 2.22 MIL/uL — ABNORMAL LOW (ref 3.87–5.11)
RDW: 21.6 % — AB (ref 11.5–15.5)
WBC: 6.7 10*3/uL (ref 4.0–10.5)

## 2015-05-17 NOTE — Progress Notes (Signed)
PULMONARY / CRITICAL CARE MEDICINE   Name: Bethany [name] MRN: [id number] DOB: Apr 03, 1951    ADMISSION DATE:  09-14-2014 CONSULTATION DATE:  05/07/2015  REFERRING MD:  Sharyon MedicusHijazi  CHIEF COMPLAINT:  Short of breath  SUBJECTIVE:  Post op trach  VITAL SIGNS: 98.9 106/57 98  rr23 93%   PHYSICAL EXAMINATION: General:  Jaundice, multiple areas ecchymosis, currently on HD Neuro:  Awake and follows some commands HEENT: New trach in place Cardiovascular:  regular Lungs: decreased bs bases, currently on PCV 20 with . 8 I time Vt 536 Abdomen:  Soft, mild distention Musculoskeletal:  1+ edema Skin:  Multiple areas of ecchymosis  LABS:  BMET  Recent Labs Lab 05/12/15 0743 05/16/2015 0625 05/17/15 0525  NA 140 138 133*  K 3.6 3.1* 3.2*  CL 102 100* 98*  CO2 25 23 22   BUN 77* 98* 69*  CREATININE 5.55* 7.62* 5.80*  GLUCOSE 157* 127* 207*    Electrolytes  Recent Labs Lab 05/12/15 0743 05/25/2015 0625 05/17/15 0525  CALCIUM 8.3* 8.0* 8.1*  PHOS 6.9* 8.0* 6.2*    CBC  Recent Labs Lab 05/12/15 0743 06/03/2015 0625 05/17/15 0525  WBC 4.1 3.9* 6.7  HGB 8.0* 7.2* 7.2*  HCT 24.4* 21.3* 22.0*  PLT 39* 42* PENDING    Coag's No results for input(s): APTT, INR in the last 168 hours.  Sepsis Markers No results for input(s): LATICACIDVEN, PROCALCITON, O2SATVEN in the last 168 hours.  ABG No results for input(s): PHART, PCO2ART, PO2ART in the last 168 hours.  Liver Enzymes  Recent Labs Lab 05/12/15 0743 05/18/2015 0625 05/17/15 0525  ALBUMIN 2.3* 2.3* 2.0*    Imaging No results found.   STUDIES:  12/26 CT abd/pelvis >> ascites, cirrhosis, splenomegaly  SIGNIFICANT EVENTS: [place] [facility] [place] [facility] [name]  LINES/TUBES: ETT >>1/3 Trach ENT 1/3>> 12/27 Rt IJ HD cath >>  DISCUSSION: 65 yo female with hypervolemia and respiratory failure in setting of Influenza B PNA, new onset seizures, ESRD and cirrhosis.  She also has hx of  DM, Hypothyroidism, Bipolar disease.  ASSESSMENT / PLAN:  Acute respiratory failure. Plan: - pressure support wean as tolerated.  - 06/12/2015 trach placed per ENT - f/u CXR intermittently  ESRD. Plan: - HD per renal  Cirrhosis. Chronic thrombocytopenia. Seizures. Plan: - per primary team   Brett CanalesSteve Carlito Bogert ACNP [facility] Pager [phone] till 3 pm If no answer page [phone] 05/17/2015, 8:50 AM

## 2015-05-18 LAB — RENAL FUNCTION PANEL
ALBUMIN: 2.1 g/dL — AB (ref 3.5–5.0)
ANION GAP: 11 (ref 5–15)
BUN: 54 mg/dL — ABNORMAL HIGH (ref 6–20)
CALCIUM: 8 mg/dL — AB (ref 8.9–10.3)
CO2: 24 mmol/L (ref 22–32)
Chloride: 101 mmol/L (ref 101–111)
Creatinine, Ser: 5.17 mg/dL — ABNORMAL HIGH (ref 0.44–1.00)
GFR calc Af Amer: 9 mL/min — ABNORMAL LOW (ref >60)
GFR, EST NON AFRICAN AMERICAN: 8 mL/min — AB (ref >60)
Glucose, Bld: 172 mg/dL — ABNORMAL HIGH (ref 65–99)
POTASSIUM: 3 mmol/L — AB (ref 3.5–5.1)
Phosphorus: 5.4 mg/dL — ABNORMAL HIGH (ref 2.5–4.6)
SODIUM: 136 mmol/L (ref 135–145)

## 2015-05-18 LAB — CBC
HEMATOCRIT: 20.7 % — AB (ref 36.0–46.0)
HEMOGLOBIN: 7 g/dL — AB (ref 12.0–15.0)
MCH: 32.9 pg (ref 26.0–34.0)
MCHC: 32.9 g/dL (ref 30.0–36.0)
MCV: 100 fL (ref 78.0–100.0)
Platelets: 40 10*3/uL — ABNORMAL LOW (ref 150–400)
RBC: 2.07 MIL/uL — AB (ref 3.87–5.11)
RDW: 21.6 % — ABNORMAL HIGH (ref 11.5–15.5)
WBC: 4.2 10*3/uL (ref 4.0–10.5)

## 2015-05-18 NOTE — Progress Notes (Signed)
  Subjective:  Pt remains on vent.  Will be converted to MWF HD today. Overall looking better. Breathing comfortably. Follows commands.   Objective:  Vital signs in last 24 hours:  ttemperature 98.8 pulse 78 respirations 16 blood pressure 110/54 pulse ox 97%    Physical Exam: General: Critically ill appearing   HEENT Eyes open, NG in place  Neck Trach in place  Pulm/lungs Bilateral rhonchi, vent assisted fio2 28%  CVS/Heart S1S2, no rub  Abdomen:  Distended from ascites BS present  Extremities: 2+ bilateral lower extremity edema  Neurologic: Awake, alert, follows commands.  Skin: No acute rashes  Access: Rt IJ temp  Cath VIR 12/27   Foley, rectal tube    Basic Metabolic Panel:   Recent Labs Lab 05/11/15 0855 05/12/15 0743 05/25/2015 0625 05/17/15 0525  NA 139 140 138 133*  K 3.1* 3.6 3.1* 3.2*  CL 101 102 100* 98*  CO2 24 25 23 22   GLUCOSE 159* 157* 127* 207*  BUN 62* 77* 98* 69*  CREATININE 4.69* 5.55* 7.62* 5.80*  CALCIUM 8.2* 8.3* 8.0* 8.1*  PHOS 5.3* 6.9* 8.0* 6.2*     CBC:  Recent Labs Lab 05/12/15 0743 05/28/2015 0625 05/17/15 0525  WBC 4.1 3.9* 6.7  HGB 8.0* 7.2* 7.2*  HCT 24.4* 21.3* 22.0*  MCV 97.2 95.9 99.1  PLT 39* 42* 52*      Microbiology:  No results found for this or any previous visit (from the past 720 hour(s)).  Coagulation Studies: No results for input(s): LABPROT, INR in the last 72 hours.  Urinalysis: No results for input(s): COLORURINE, LABSPEC, PHURINE, GLUCOSEU, HGBUR, BILIRUBINUR, KETONESUR, PROTEINUR, UROBILINOGEN, NITRITE, LEUKOCYTESUR in the last 72 hours.  Invalid input(s): APPERANCEUR    Imaging: No results found.   Medications:       Assessment/ Plan:  65 y.o. female with a PMHx of diabetes mellitus, liver cirrhosis,chronic kidney disease stage IV baseline creatinine 2.8, thyroid disease, bipolar disorder, GERD, recurrent urinary tract infections, chronic pancytopenia, who was admitted to [facility] on 2014/05/13 for ongoing treatment of acute respiratory failure, liver cirrhosis, acute renal failure.  1. Acute renal failure. ? Hepatorenal, vs Vanc toxicity, vs diuresis 2. Chronic kidney disease stage IV baseline creatinine 2.8. 3. Metabolic acidosis. 4. Acute respiratory failure. 5. Liver cirrhosis with ascites 6. Thrombocytopenia. Pack catheter with citrate 7.  Anemia of CKD.  Plan: Patient will be due for hemodialysis today. We will plan for a short hemodialysis treatment today and continue her on a Monday, Wednesday, Friday schedule. She has been started on Aranesp per protocol. Respiratory failure persists and hopefully she can be weaned from the ventilator. FiO2 currently 28%. Continue to pack the catheter with citrate given thrombocytopenia. Patient may have progressed to end-stage renal disease however we will await additional time before making this determination.   LOS:  Bethany [name] 1/6/20178:51 AM

## 2015-05-19 LAB — C DIFFICILE QUICK SCREEN W PCR REFLEX
C DIFFICILE (CDIFF) INTERP: POSITIVE
C Diff antigen: POSITIVE — AB
C Diff toxin: POSITIVE — AB

## 2015-05-19 LAB — HEPATIC FUNCTION PANEL
ALK PHOS: 223 U/L — AB (ref 38–126)
ALT: 50 U/L (ref 14–54)
AST: 105 U/L — AB (ref 15–41)
Albumin: 2.1 g/dL — ABNORMAL LOW (ref 3.5–5.0)
BILIRUBIN DIRECT: 5.9 mg/dL — AB (ref 0.1–0.5)
Indirect Bilirubin: 4.9 mg/dL — ABNORMAL HIGH (ref 0.3–0.9)
Total Bilirubin: 10.8 mg/dL — ABNORMAL HIGH (ref 0.3–1.2)
Total Protein: 4.8 g/dL — ABNORMAL LOW (ref 6.5–8.1)

## 2015-05-20 LAB — BASIC METABOLIC PANEL
Anion gap: 12 (ref 5–15)
BUN: 55 mg/dL — AB (ref 6–20)
CHLORIDE: 99 mmol/L — AB (ref 101–111)
CO2: 25 mmol/L (ref 22–32)
CREATININE: 5.26 mg/dL — AB (ref 0.44–1.00)
Calcium: 7.9 mg/dL — ABNORMAL LOW (ref 8.9–10.3)
GFR calc Af Amer: 9 mL/min — ABNORMAL LOW (ref >60)
GFR calc non Af Amer: 8 mL/min — ABNORMAL LOW (ref >60)
GLUCOSE: 186 mg/dL — AB (ref 65–99)
POTASSIUM: 2.7 mmol/L — AB (ref 3.5–5.1)
SODIUM: 136 mmol/L (ref 135–145)

## 2015-05-20 LAB — CBC
HEMATOCRIT: 20 % — AB (ref 36.0–46.0)
HEMOGLOBIN: 6.5 g/dL — AB (ref 12.0–15.0)
MCH: 32.8 pg (ref 26.0–34.0)
MCHC: 32.5 g/dL (ref 30.0–36.0)
MCV: 101 fL — AB (ref 78.0–100.0)
Platelets: 35 10*3/uL — ABNORMAL LOW (ref 150–400)
RBC: 1.98 MIL/uL — ABNORMAL LOW (ref 3.87–5.11)
RDW: 21.3 % — ABNORMAL HIGH (ref 11.5–15.5)
WBC: 3.1 10*3/uL — ABNORMAL LOW (ref 4.0–10.5)

## 2015-05-20 LAB — PREPARE RBC (CROSSMATCH)

## 2015-05-21 ENCOUNTER — Other Ambulatory Visit (HOSPITAL_COMMUNITY): Payer: Self-pay

## 2015-05-21 LAB — CBC
HCT: 21.2 % — ABNORMAL LOW (ref 36.0–46.0)
Hemoglobin: 7 g/dL — ABNORMAL LOW (ref 12.0–15.0)
MCH: 33.3 pg (ref 26.0–34.0)
MCHC: 33 g/dL (ref 30.0–36.0)
MCV: 101 fL — ABNORMAL HIGH (ref 78.0–100.0)
PLATELETS: 40 10*3/uL — AB (ref 150–400)
RBC: 2.1 MIL/uL — AB (ref 3.87–5.11)
RDW: 30.9 % — AB (ref 11.5–15.5)
WBC: 3.9 10*3/uL — AB (ref 4.0–10.5)

## 2015-05-21 LAB — RENAL FUNCTION PANEL
ALBUMIN: 2.2 g/dL — AB (ref 3.5–5.0)
Anion gap: 15 (ref 5–15)
BUN: 75 mg/dL — AB (ref 6–20)
CALCIUM: 7.9 mg/dL — AB (ref 8.9–10.3)
CO2: 22 mmol/L (ref 22–32)
CREATININE: 6.63 mg/dL — AB (ref 0.44–1.00)
Chloride: 100 mmol/L — ABNORMAL LOW (ref 101–111)
GFR, EST AFRICAN AMERICAN: 7 mL/min — AB (ref >60)
GFR, EST NON AFRICAN AMERICAN: 6 mL/min — AB (ref >60)
Glucose, Bld: 139 mg/dL — ABNORMAL HIGH (ref 65–99)
PHOSPHORUS: 7.8 mg/dL — AB (ref 2.5–4.6)
Potassium: 3.2 mmol/L — ABNORMAL LOW (ref 3.5–5.1)
SODIUM: 137 mmol/L (ref 135–145)

## 2015-05-21 NOTE — Progress Notes (Signed)
Subjective:  Pt seen at bedside. Due for HD again today.   Interactive today. Remains on the vent.    Objective:  Vital signs in last 24 hours:  ttemperature 97.5 pulse 75 respirations 14 blood pressure 116/50     Physical Exam: General: Critically ill appearing   HEENT Eyes open, NG in place  Neck Trach in place  Pulm/lungs Bilateral rhonchi, vent assisted fio2 28%  CVS/Heart S1S2, no rub  Abdomen:  Distended from ascites BS present  Extremities: 2+ bilateral lower extremity edema  Neurologic: Awake, alert, follows commands.  Skin: No acute rashes  Access: Rt IJ temp  Cath VIR 12/27   Foley, rectal tube    Basic Metabolic Panel:   Recent Labs Lab 05/23/2015 0625 05/17/15 0525 05/18/15 0839 05/20/15 0617  NA 138 133* 136 136  K 3.1* 3.2* 3.0* 2.7*  CL 100* 98* 101 99*  CO2 23 22 24 25   GLUCOSE 127* 207* 172* 186*  BUN 98* 69* 54* 55*  CREATININE 7.62* 5.80* 5.17* 5.26*  CALCIUM 8.0* 8.1* 8.0* 7.9*  PHOS 8.0* 6.2* 5.4*  --      CBC:  Recent Labs Lab 05/14/2015 0625 05/17/15 0525 05/18/15 0839 05/20/15 0617  WBC 3.9* 6.7 4.2 3.1*  HGB 7.2* 7.2* 7.0* 6.5*  HCT 21.3* 22.0* 20.7* 20.0*  MCV 95.9 99.1 100.0 101.0*  PLT 42* 52* 40* 35*      Microbiology:  Recent Results (from the past 720 hour(s))  C difficile quick scan w PCR reflex     Status: Abnormal   Collection Time: 05/19/15  2:30 AM  Result Value Ref Range Status   C Diff antigen POSITIVE (A) NEGATIVE Final   C Diff toxin POSITIVE (A) NEGATIVE Final   C Diff interpretation Positive for toxigenic C. difficile  Final    Comment: CRITICAL RESULT CALLED TO, READ BACK BY AND VERIFIED WITH: C MASEKO @0606  05/19/15 MKELLY     Coagulation Studies: No results for input(s): LABPROT, INR in the last 72 hours.  Urinalysis: No results for input(s): COLORURINE, LABSPEC, PHURINE, GLUCOSEU, HGBUR, BILIRUBINUR, KETONESUR, PROTEINUR, UROBILINOGEN, NITRITE, LEUKOCYTESUR in the last 72 hours.  Invalid  input(s): APPERANCEUR    Imaging: Dg Chest Port 1 View  05/21/2015  CLINICAL DATA:  Respiratory failure. EXAM: PORTABLE CHEST 1 VIEW COMPARISON:  05/14/2015. FINDINGS: Tracheostomy tube, NG tube, right IJ line in stable position. Mediastinum hilar structures normal. Cardiomegaly with interim improvement pulmonary venous congestion. Low lung volumes with basilar atelectasis. No pleural effusion pneumothorax P IMPRESSION: 1. Lines and tubes in stable position. 2. Cardiomegaly with interim improvement pulmonary venous congestion. 3. Low lung volumes with basilar atelectasis. Electronically Signed   By: Maisie Fus  Register   On: 05/21/2015 07:42     Medications:       Assessment/ Plan:  65 y.o. female with a PMHx of diabetes mellitus, liver cirrhosis,chronic kidney disease stage IV baseline creatinine 2.8, thyroid disease, bipolar disorder, GERD, recurrent urinary tract infections, chronic pancytopenia, who was admitted to [facility] on May 05, 2015 for ongoing treatment of acute respiratory failure, liver cirrhosis, acute renal failure.  1. Acute renal failure. ? Hepatorenal, vs Vanc toxicity, vs diuresis 2. Chronic kidney disease stage IV baseline creatinine 2.8. 3. Metabolic acidosis. 4. Acute respiratory failure. 5. Liver cirrhosis with ascites 6. Thrombocytopenia. Pack catheter with citrate 7.  Anemia of CKD.  Plan: Pt due for HD today, orders have been prepared.  Cr remains well above baseline at the moment, therefore will continue HD on MWF  schedule.  Hgb was down to 6.5 at last check, was transfused one unit prbc yesterday.  Treatment of respiratory failure per pulm/cc,  Remains on minimal fio2 on the vent.  Will also continue to pack catheter with citrate.     LOS:  Bethany [name] 1/9/20178:55 AM

## 2015-05-22 NOTE — Progress Notes (Signed)
PULMONARY / CRITICAL CARE MEDICINE   Name: Bethany [name] MRN: [id number] DOB: [birth year]    ADMISSION DATE:  04/29/2015 CONSULTATION DATE:  05/07/2015  REFERRING MD:  Sharyon MedicusHijazi  CHIEF COMPLAINT:  Short of breath  SUBJECTIVE:  Tolerating trach collar.  VITAL SIGNS: T 98.4, HR 84, RR 16, BP 122/48, SaO2 100%  PHYSICAL EXAMINATION: General:  Jaundice Neuro:  Awake and follows commands HEENT: Trach site clean Cardiovascular:  regular Lungs: no wheeze Abdomen:  Soft, mild distention Musculoskeletal:  1+ edema Skin:  Multiple areas of ecchymosis  LABS:  BMET  Recent Labs Lab 05/18/15 0839 05/20/15 0617 05/21/15 1045  NA 136 136 137  K 3.0* 2.7* 3.2*  CL 101 99* 100*  CO2 24 25 22   BUN 54* 55* 75*  CREATININE 5.17* 5.26* 6.63*  GLUCOSE 172* 186* 139*    Electrolytes  Recent Labs Lab 05/17/15 0525 05/18/15 0839 05/20/15 0617 05/21/15 1045  CALCIUM 8.1* 8.0* 7.9* 7.9*  PHOS 6.2* 5.4*  --  7.8*    CBC  Recent Labs Lab 05/18/15 0839 05/20/15 0617 05/21/15 1045  WBC 4.2 3.1* 3.9*  HGB 7.0* 6.5* 7.0*  HCT 20.7* 20.0* 21.2*  PLT 40* 35* 40*    Liver Enzymes  Recent Labs Lab 05/18/15 0839 05/19/15 0635 05/21/15 1045  AST  --  105*  --   ALT  --  50  --   ALKPHOS  --  223*  --   BILITOT  --  10.8*  --   ALBUMIN 2.1* 2.1* 2.2*    Imaging Dg Chest Port 1 View  05/21/2015  CLINICAL DATA:  Respiratory failure. EXAM: PORTABLE CHEST 1 VIEW COMPARISON:  05/14/2015. FINDINGS: Tracheostomy tube, NG tube, right IJ line in stable position. Mediastinum hilar structures normal. Cardiomegaly with interim improvement pulmonary venous congestion. Low lung volumes with basilar atelectasis. No pleural effusion pneumothorax P IMPRESSION: 1. Lines and tubes in stable position. 2. Cardiomegaly with interim improvement pulmonary venous congestion. 3. Low lung volumes with basilar atelectasis. Electronically Signed   By: Maisie Fushomas  Register   On: 05/21/2015 07:42    STUDIES:   12/26 CT abd/pelvis >> ascites, cirrhosis, splenomegaly  SIGNIFICANT EVENTS: [place]20 [facility] [place] [facility] Tr[facility] per ENT, Dr. [name]  LINES/TUBES: 12/27 Rt IJ HD cath >>  DISCUSSION: 65 yo female with hypervolemia and respiratory failure in setting of Influenza B PNA, new onset seizures, ESRD and cirrhosis.  She also has hx of DM, Hypothyroidism, Bipolar disease.  ASSESSMENT / PLAN:  Acute respiratory failure. Failure to wean from ventilator s/p tracheostomy. Plan: - trach collar as tolerated >> plan for 2 hrs on 1/10 - f/u CXR intermittently  ESRD. Plan: - HD per renal  Cirrhosis. Chronic thrombocytopenia. Seizures. Plan: - per primary team  C diff positive 1/07. Plan: - flagyl per primary team  PCCM will f/u 1/16 >> call if help needed sooner.  Bethany HellingVineet Danel Studzinski, MD [facility] Pulmonary/Critical Care 05/22/2015, 10:43 AM Pager:  [phone] After 3pm call: [phone]

## 2015-05-23 LAB — CBC
HCT: 23 % — ABNORMAL LOW (ref 36.0–46.0)
Hemoglobin: 7.5 g/dL — ABNORMAL LOW (ref 12.0–15.0)
MCH: 32.5 pg (ref 26.0–34.0)
MCHC: 32.6 g/dL (ref 30.0–36.0)
MCV: 99.6 fL (ref 78.0–100.0)
PLATELETS: 35 10*3/uL — AB (ref 150–400)
RBC: 2.31 MIL/uL — ABNORMAL LOW (ref 3.87–5.11)
RDW: 21.8 % — AB (ref 11.5–15.5)
WBC: 3.7 10*3/uL — AB (ref 4.0–10.5)

## 2015-05-23 LAB — RENAL FUNCTION PANEL
Albumin: 2.1 g/dL — ABNORMAL LOW (ref 3.5–5.0)
Anion gap: 13 (ref 5–15)
BUN: 65 mg/dL — AB (ref 6–20)
CALCIUM: 7.7 mg/dL — AB (ref 8.9–10.3)
CHLORIDE: 98 mmol/L — AB (ref 101–111)
CO2: 23 mmol/L (ref 22–32)
CREATININE: 5.59 mg/dL — AB (ref 0.44–1.00)
GFR calc Af Amer: 8 mL/min — ABNORMAL LOW (ref >60)
GFR, EST NON AFRICAN AMERICAN: 7 mL/min — AB (ref >60)
Glucose, Bld: 134 mg/dL — ABNORMAL HIGH (ref 65–99)
Phosphorus: 6.8 mg/dL — ABNORMAL HIGH (ref 2.5–4.6)
Potassium: 3.3 mmol/L — ABNORMAL LOW (ref 3.5–5.1)
SODIUM: 134 mmol/L — AB (ref 135–145)

## 2015-05-23 NOTE — Progress Notes (Signed)
  Subjective:  Patient completed hemodialysis today. Ultrafiltration was 1.5 kg. Post blood pressure was 102/40. Currently off the ventilator and on trach collar.  Objective:  Vital signs in last 24 hours:  Temperature 98.0 pulse 75 respirations 17 blood pressure 102/40 pulse ox 99% on trach collar     Physical Exam: General: No acute distress  HEENT Eyes open, NG in place  Neck Trach in place  Pulm/lungs Bilateral rhonchi, on trach collar  CVS/Heart S1S2, no rub  Abdomen:  Distended from ascites BS present  Extremities: 1+ bilateral lower extremity edema  Neurologic: Awake, alert, follows commands.  Skin: No acute rashes  Access: Rt IJ temp  Cath VIR 12/27   Foley, rectal tube    Basic Metabolic Panel:   Recent Labs Lab 05/17/15 0525 05/18/15 0839 05/20/15 0617 05/21/15 1045 05/23/15 0603  NA 133* 136 136 137 134*  K 3.2* 3.0* 2.7* 3.2* 3.3*  CL 98* 101 99* 100* 98*  CO2 22 24 25 22 23   GLUCOSE 207* 172* 186* 139* 134*  BUN 69* 54* 55* 75* 65*  CREATININE 5.80* 5.17* 5.26* 6.63* 5.59*  CALCIUM 8.1* 8.0* 7.9* 7.9* 7.7*  PHOS 6.2* 5.4*  --  7.8* 6.8*     CBC:  Recent Labs Lab 05/17/15 0525 05/18/15 0839 05/20/15 0617 05/21/15 1045 05/23/15 0603  WBC 6.7 4.2 3.1* 3.9* 3.7*  HGB 7.2* 7.0* 6.5* 7.0* 7.5*  HCT 22.0* 20.7* 20.0* 21.2* 23.0*  MCV 99.1 100.0 101.0* 101.0* 99.6  PLT 52* 40* 35* 40* 35*      Microbiology:  Recent Results (from the past 720 hour(s))  C difficile quick scan w PCR reflex     Status: Abnormal   Collection Time: 05/19/15  2:30 AM  Result Value Ref Range Status   C Diff antigen POSITIVE (A) NEGATIVE Final   C Diff toxin POSITIVE (A) NEGATIVE Final   C Diff interpretation Positive for toxigenic C. difficile  Final    Comment: CRITICAL RESULT CALLED TO, READ BACK BY AND VERIFIED WITH: C MASEKO @0606  05/19/15 MKELLY     Coagulation Studies: No results for input(s): LABPROT, INR in the last 72 hours.  Urinalysis: No  results for input(s): COLORURINE, LABSPEC, PHURINE, GLUCOSEU, HGBUR, BILIRUBINUR, KETONESUR, PROTEINUR, UROBILINOGEN, NITRITE, LEUKOCYTESUR in the last 72 hours.  Invalid input(s): APPERANCEUR    Imaging: No results found.   Medications:       Assessment/ Plan:  65 y.o. female with a PMHx of diabetes mellitus, liver cirrhosis,chronic kidney disease stage IV baseline creatinine 2.8, thyroid disease, bipolar disorder, GERD, recurrent urinary tract infections, chronic pancytopenia, who was admitted to [facility] on 04/19/2015 for ongoing treatment of acute respiratory failure, liver cirrhosis, acute renal failure.  1. Acute renal failure. ? Hepatorenal, vs Vanc toxicity, vs diuresis 2. Chronic kidney disease stage IV baseline creatinine 2.8. 3. Metabolic acidosis. 4. Acute respiratory failure. 5. Liver cirrhosis with ascites 6. Thrombocytopenia. Pack catheter with citrate 7.  Anemia of CKD.  Plan: Patient completed hemodialysis today. Ultrafiltration achieved was 1.5 kg. We plan to perform dialysis again on Friday. We will continue to use albumin for blood pressure support. Potassium was noted as being low at 3.3 and 4 potassium bath was used today. We will continue to pack the catheter with citrate given history of thrombocytopenia. Hemoglobin currently up to 7.5 as well. Will start Aranesp per protocol as well.  Continue to monitor progress closely.   LOS:  Joven Mom 1/11/20173:50 PM

## 2015-05-24 LAB — TYPE AND SCREEN
ABO/RH(D): A POS
Antibody Screen: POSITIVE
DAT, IGG: NEGATIVE
DONOR AG TYPE: NEGATIVE
UNIT DIVISION: 0
UNIT DIVISION: 0
Unit division: 0
Unit division: 0
Unit division: 0

## 2015-05-25 ENCOUNTER — Other Ambulatory Visit (HOSPITAL_COMMUNITY): Payer: Self-pay

## 2015-05-25 LAB — RENAL FUNCTION PANEL
ALBUMIN: 2 g/dL — AB (ref 3.5–5.0)
Anion gap: 13 (ref 5–15)
BUN: 58 mg/dL — AB (ref 6–20)
CALCIUM: 8.2 mg/dL — AB (ref 8.9–10.3)
CO2: 24 mmol/L (ref 22–32)
CREATININE: 5.34 mg/dL — AB (ref 0.44–1.00)
Chloride: 100 mmol/L — ABNORMAL LOW (ref 101–111)
GFR calc Af Amer: 9 mL/min — ABNORMAL LOW (ref >60)
GFR calc non Af Amer: 8 mL/min — ABNORMAL LOW (ref >60)
GLUCOSE: 168 mg/dL — AB (ref 65–99)
PHOSPHORUS: 6.6 mg/dL — AB (ref 2.5–4.6)
Potassium: 3.6 mmol/L (ref 3.5–5.1)
SODIUM: 137 mmol/L (ref 135–145)

## 2015-05-25 LAB — COMPREHENSIVE METABOLIC PANEL
ALT: 42 U/L (ref 14–54)
AST: 78 U/L — ABNORMAL HIGH (ref 15–41)
Albumin: 2.1 g/dL — ABNORMAL LOW (ref 3.5–5.0)
Alkaline Phosphatase: 183 U/L — ABNORMAL HIGH (ref 38–126)
Anion gap: 14 (ref 5–15)
BILIRUBIN TOTAL: 10.3 mg/dL — AB (ref 0.3–1.2)
BUN: 59 mg/dL — ABNORMAL HIGH (ref 6–20)
CHLORIDE: 99 mmol/L — AB (ref 101–111)
CO2: 23 mmol/L (ref 22–32)
Calcium: 8.2 mg/dL — ABNORMAL LOW (ref 8.9–10.3)
Creatinine, Ser: 5.43 mg/dL — ABNORMAL HIGH (ref 0.44–1.00)
GFR, EST AFRICAN AMERICAN: 9 mL/min — AB (ref >60)
GFR, EST NON AFRICAN AMERICAN: 8 mL/min — AB (ref >60)
Glucose, Bld: 169 mg/dL — ABNORMAL HIGH (ref 65–99)
POTASSIUM: 3.5 mmol/L (ref 3.5–5.1)
Sodium: 136 mmol/L (ref 135–145)
TOTAL PROTEIN: 5.2 g/dL — AB (ref 6.5–8.1)

## 2015-05-25 LAB — CBC
HCT: 23.2 % — ABNORMAL LOW (ref 36.0–46.0)
HEMOGLOBIN: 7.7 g/dL — AB (ref 12.0–15.0)
MCH: 33.5 pg (ref 26.0–34.0)
MCHC: 33.2 g/dL (ref 30.0–36.0)
MCV: 100.9 fL — ABNORMAL HIGH (ref 78.0–100.0)
PLATELETS: 33 10*3/uL — AB (ref 150–400)
RBC: 2.3 MIL/uL — ABNORMAL LOW (ref 3.87–5.11)
RDW: 21.5 % — AB (ref 11.5–15.5)
WBC: 3.8 10*3/uL — ABNORMAL LOW (ref 4.0–10.5)

## 2015-05-25 NOTE — Progress Notes (Signed)
  Subjective:  Patient due for hemodialysis today. Back on the vent this AM. Fio2 28%.   Objective:  Vital signs in last 24 hours:  Temperature 98.0 pulse 75 respirations 17 blood pressure 102/40 pulse ox 99% on trach collar     Physical Exam: General: No acute distress  HEENT Eyes open, NG in place  Neck Trach in place  Pulm/lungs Bilateral rhonchi, on vent  CVS/Heart S1S2, no rub  Abdomen:  Distended from ascites BS present  Extremities: 1+ bilateral lower extremity edema  Neurologic: Awake, alert, follows commands.  Skin: No acute rashes  Access: Rt IJ temp  Cath VIR 12/27   Foley, rectal tube    Basic Metabolic Panel:   Recent Labs Lab 05/18/15 0839 05/20/15 0617 05/21/15 1045 05/23/15 0603  NA 136 136 137 134*  K 3.0* 2.7* 3.2* 3.3*  CL 101 99* 100* 98*  CO2 24 25 22 23   GLUCOSE 172* 186* 139* 134*  BUN 54* 55* 75* 65*  CREATININE 5.17* 5.26* 6.63* 5.59*  CALCIUM 8.0* 7.9* 7.9* 7.7*  PHOS 5.4*  --  7.8* 6.8*     CBC:  Recent Labs Lab 05/18/15 0839 05/20/15 0617 05/21/15 1045 05/23/15 0603  WBC 4.2 3.1* 3.9* 3.7*  HGB 7.0* 6.5* 7.0* 7.5*  HCT 20.7* 20.0* 21.2* 23.0*  MCV 100.0 101.0* 101.0* 99.6  PLT 40* 35* 40* 35*      Microbiology:  Recent Results (from the past 720 hour(s))  C difficile quick scan w PCR reflex     Status: Abnormal   Collection Time: 05/19/15  2:30 AM  Result Value Ref Range Status   C Diff antigen POSITIVE (A) NEGATIVE Final   C Diff toxin POSITIVE (A) NEGATIVE Final   C Diff interpretation Positive for toxigenic C. difficile  Final    Comment: CRITICAL RESULT CALLED TO, READ BACK BY AND VERIFIED WITH: C MASEKO @0606  05/19/15 MKELLY     Coagulation Studies: No results for input(s): LABPROT, INR in the last 72 hours.  Urinalysis: No results for input(s): COLORURINE, LABSPEC, PHURINE, GLUCOSEU, HGBUR, BILIRUBINUR, KETONESUR, PROTEINUR, UROBILINOGEN, NITRITE, LEUKOCYTESUR in the last 72 hours.  Invalid input(s):  APPERANCEUR    Imaging: No results found.   Medications:       Assessment/ Plan:  65 y.o. female with a PMHx of diabetes mellitus, liver cirrhosis,chronic kidney disease stage IV baseline creatinine 2.8, thyroid disease, bipolar disorder, GERD, recurrent urinary tract infections, chronic pancytopenia, who was admitted to [facility] on 05/02/2015 for ongoing treatment of acute respiratory failure, liver cirrhosis, acute renal failure.  1. Acute renal failure. ? Hepatorenal, vs Vanc toxicity, vs diuresis 2. Chronic kidney disease stage IV baseline creatinine 2.8. 3. Metabolic acidosis. 4. Acute respiratory failure. 5. Liver cirrhosis with ascites 6. Thrombocytopenia. Pack catheter with citrate 7.  Anemia of CKD.  Plan: Overall renal function remains poor. Therefore we will proceed with hemodialysis again today. She may end up progressing to end-stage renal disease. She remains on the ventilator at this time but does have periods of being on the trach collar. FiO2 this morning is 28%. Patient has been started on Aranesp per protocol. Hemoglobin currently 7.5. We will continue to perform dialysis on Monday, Wednesday, Friday schedule.  LOS:  Philmore Lepore 1/13/20177:40 AM

## 2015-05-28 DIAGNOSIS — Z930 Tracheostomy status: Secondary | ICD-10-CM

## 2015-05-28 DIAGNOSIS — R0902 Hypoxemia: Secondary | ICD-10-CM

## 2015-05-28 LAB — RENAL FUNCTION PANEL
ALBUMIN: 2.1 g/dL — AB (ref 3.5–5.0)
ANION GAP: 14 (ref 5–15)
BUN: 77 mg/dL — ABNORMAL HIGH (ref 6–20)
CALCIUM: 7.7 mg/dL — AB (ref 8.9–10.3)
CO2: 20 mmol/L — ABNORMAL LOW (ref 22–32)
Chloride: 103 mmol/L (ref 101–111)
Creatinine, Ser: 5.87 mg/dL — ABNORMAL HIGH (ref 0.44–1.00)
GFR calc non Af Amer: 7 mL/min — ABNORMAL LOW (ref >60)
GFR, EST AFRICAN AMERICAN: 8 mL/min — AB (ref >60)
GLUCOSE: 171 mg/dL — AB (ref 65–99)
PHOSPHORUS: 7.4 mg/dL — AB (ref 2.5–4.6)
Potassium: 4.3 mmol/L (ref 3.5–5.1)
SODIUM: 137 mmol/L (ref 135–145)

## 2015-05-28 LAB — CBC
HEMATOCRIT: 21.1 % — AB (ref 36.0–46.0)
HEMOGLOBIN: 7 g/dL — AB (ref 12.0–15.0)
MCH: 33.5 pg (ref 26.0–34.0)
MCHC: 33.2 g/dL (ref 30.0–36.0)
MCV: 101 fL — AB (ref 78.0–100.0)
Platelets: 37 10*3/uL — ABNORMAL LOW (ref 150–400)
RBC: 2.09 MIL/uL — ABNORMAL LOW (ref 3.87–5.11)
RDW: 21 % — ABNORMAL HIGH (ref 11.5–15.5)
WBC: 4.2 10*3/uL (ref 4.0–10.5)

## 2015-05-28 NOTE — Progress Notes (Signed)
PULMONARY / CRITICAL CARE MEDICINE   Name: Bethany [name] MRN: [id number] DOB: Mar 13, 1951    ADMISSION DATE:  2014-09-30 CONSULTATION DATE:  05/07/2015  REFERRING MD:  Sharyon MedicusHijazi  CHIEF COMPLAINT:  Short of breath  SUBJECTIVE:  Tolerating trach collar.  VITAL SIGNS: 98.5 hr 83 rr 20, 121/59 sats 95%  PHYSICAL EXAMINATION: General:  Jaundice, no  Distress.  Neuro:  Awake and follows commands HEENT: Trach site clean-->excellent phonation  Cardiovascular:  regular Lungs: no wheeze Abdomen:  Soft, mild distention Musculoskeletal:  1+ edema Skin:  Multiple areas of ecchymosis  LABS:  BMET  Recent Labs Lab 05/23/15 0603 05/25/15 0800 05/28/15 0521  NA 134* 136  137 137  K 3.3* 3.5  3.6 4.3  CL 98* 99*  100* 103  CO2 23 23  24  20*  BUN 65* 59*  58* 77*  CREATININE 5.59* 5.43*  5.34* 5.87*  GLUCOSE 134* 169*  168* 171*    Electrolytes  Recent Labs Lab 05/23/15 0603 05/25/15 0800 05/28/15 0521  CALCIUM 7.7* 8.2*  8.2* 7.7*  PHOS 6.8* 6.6* 7.4*    CBC  Recent Labs Lab 05/23/15 0603 05/25/15 0800 05/28/15 0458  WBC 3.7* 3.8* 4.2  HGB 7.5* 7.7* 7.0*  HCT 23.0* 23.2* 21.1*  PLT 35* 33* 37*    Liver Enzymes  Recent Labs Lab 05/23/15 0603 05/25/15 0800 05/28/15 0521  AST  --  78*  --   ALT  --  42  --   ALKPHOS  --  183*  --   BILITOT  --  10.3*  --   ALBUMIN 2.1* 2.1*  2.0* 2.1*    Imaging No results found.  STUDIES:  12/26 CT abd/pelvis >> ascites, cirrhosis, splenomegaly  SIGNIFICANT EVENTS: [place] [facility] [place] [facility] per ENT, Dr. [name]  LINES/TUBES: 12/27 Rt IJ HD cath >>  DISCUSSION: 65 yo female with hypervolemia and respiratory failure in setting of Influenza B PNA, new onset seizures, ESRD and cirrhosis.  She also has hx of DM, Hypothyroidism, Bipolar disease.  ASSESSMENT / PLAN:  Acute respiratory failure. Failure to wean from ventilator s/p tracheostomy-->now greatly improved.   Plan: Transition to 4 cuffless and begin capping trials Hope to decannulate in next 48hrs   ESRD. Plan: - HD per renal  Cirrhosis. Chronic thrombocytopenia. ACD Seizures. Plan: - per primary team  C diff positive 1/07. Plan: - flagyl per primary team  Simonne MartinetPeter E Babcock ACNP-BC [facility] Pulmonary/Critical Care Pager # [phone] OR # [phone] if no answer  Attending Note:  65 year old female with VDRF due to flu B infection with pneumonia and volume overload who was trached by ENT and weaning.  Currently with a 6 cuffless trach on TC.  On exam, lung with coarse BS diffusely.  I reviewed CXR myself, trach in good position and some interstitial prominence.  Discussed with RT and PCCM-NP.  VDRF: off vent and doing well.  - Titrate O2 for sat of 88-92%.  - Monitor for airway protection.  - Keep dry as able.  Hypoxemia:  - Titrate O2 for sats as able.  Trach status:  - Change to cuffless 4 today.  - Cap trach as tolerated.  - If continues to be capped by Wednesday will consider decannulation.  Seizure disorder:  - Continue medications as ordered.  C. Diff colitis:  - Flagyl.  Patient seen and examined, agree with above note.  I dictated the care and orders written for this patient under my direction.  Wesam  Kathryne Sharper, MD [phone]

## 2015-05-28 NOTE — Progress Notes (Signed)
  Subjective:  Patient underwent hemodialysis today. Trach, capped NGT in place UF 2000 cc.   Objective:  Vital signs in last 24 hours:  Temperature 97 pulse 81 respirations 15 blood pressure 111/47     Physical Exam: General: No acute distress  HEENT Eyes open, NG in place  Neck Trach in place  Pulm/lungs Bilateral rhonchi, on vent  CVS/Heart S1S2, no rub  Abdomen:  Distended from ascites BS present  Extremities: 1+ bilateral lower extremity edema  Neurologic: Awake, alert, follows commands.  Skin: No acute rashes  Access: Rt IJ temp  Cath VIR 12/27   Foley, rectal tube    Basic Metabolic Panel:   Recent Labs Lab 05/23/15 0603 05/25/15 0800 05/28/15 0521  NA 134* 136  137 137  K 3.3* 3.5  3.6 4.3  CL 98* 99*  100* 103  CO2 23 23  24  20*  GLUCOSE 134* 169*  168* 171*  BUN 65* 59*  58* 77*  CREATININE 5.59* 5.43*  5.34* 5.87*  CALCIUM 7.7* 8.2*  8.2* 7.7*  PHOS 6.8* 6.6* 7.4*     CBC:  Recent Labs Lab 05/23/15 0603 05/25/15 0800 05/28/15 0458  WBC 3.7* 3.8* 4.2  HGB 7.5* 7.7* 7.0*  HCT 23.0* 23.2* 21.1*  MCV 99.6 100.9* 101.0*  PLT 35* 33* 37*      Microbiology:  Recent Results (from the past 720 hour(s))  C difficile quick scan w PCR reflex     Status: Abnormal   Collection Time: 05/19/15  2:30 AM  Result Value Ref Range Status   C Diff antigen POSITIVE (A) NEGATIVE Final   C Diff toxin POSITIVE (A) NEGATIVE Final   C Diff interpretation Positive for toxigenic C. difficile  Final    Comment: CRITICAL RESULT CALLED TO, READ BACK BY AND VERIFIED WITH: C MASEKO @0606  05/19/15 MKELLY     Coagulation Studies: No results for input(s): LABPROT, INR in the last 72 hours.  Urinalysis: No results for input(s): COLORURINE, LABSPEC, PHURINE, GLUCOSEU, HGBUR, BILIRUBINUR, KETONESUR, PROTEINUR, UROBILINOGEN, NITRITE, LEUKOCYTESUR in the last 72 hours.  Invalid input(s): APPERANCEUR    Imaging: No results found.   Medications:        Assessment/ Plan:  65 y.o. female with a PMHx of diabetes mellitus, liver cirrhosis,chronic kidney disease stage IV baseline creatinine 2.8, thyroid disease, bipolar disorder, GERD, recurrent urinary tract infections, chronic pancytopenia, who was admitted to [facility] on 04/17/2015 for ongoing treatment of acute respiratory failure, liver cirrhosis, acute renal failure.  1. Acute renal failure. ? Hepatorenal, vs Vanc toxicity, vs diuresis 2. Chronic kidney disease stage IV baseline creatinine 2.8. 3. Metabolic acidosis. 4. Acute respiratory failure. 5. Liver cirrhosis with ascites 6. Thrombocytopenia. Pack catheter with citrate 7.  Anemia of CKD.  Plan: Overall renal function remains poor.  She may end up progressing to end-stage renal disease. Patient has been started on Aranesp per protocol. Hemoglobin currently 7.0. We will continue to perform dialysis on Monday, Wednesday, Friday schedule.  LOS:  Clerence Gubser 1/16/20173:51 PM

## 2015-05-29 ENCOUNTER — Other Ambulatory Visit (HOSPITAL_COMMUNITY): Payer: Self-pay

## 2015-05-30 LAB — RENAL FUNCTION PANEL
ANION GAP: 8 (ref 5–15)
Albumin: 2 g/dL — ABNORMAL LOW (ref 3.5–5.0)
BUN: 59 mg/dL — ABNORMAL HIGH (ref 6–20)
CALCIUM: 8.2 mg/dL — AB (ref 8.9–10.3)
CO2: 25 mmol/L (ref 22–32)
Chloride: 103 mmol/L (ref 101–111)
Creatinine, Ser: 5.43 mg/dL — ABNORMAL HIGH (ref 0.44–1.00)
GFR calc non Af Amer: 8 mL/min — ABNORMAL LOW (ref >60)
GFR, EST AFRICAN AMERICAN: 9 mL/min — AB (ref >60)
GLUCOSE: 71 mg/dL (ref 65–99)
PHOSPHORUS: 6.8 mg/dL — AB (ref 2.5–4.6)
POTASSIUM: 4.3 mmol/L (ref 3.5–5.1)
SODIUM: 136 mmol/L (ref 135–145)

## 2015-05-30 LAB — CBC
HEMATOCRIT: 20.3 % — AB (ref 36.0–46.0)
HEMOGLOBIN: 6.8 g/dL — AB (ref 12.0–15.0)
MCH: 34.5 pg — AB (ref 26.0–34.0)
MCHC: 33.5 g/dL (ref 30.0–36.0)
MCV: 103 fL — AB (ref 78.0–100.0)
Platelets: 31 10*3/uL — ABNORMAL LOW (ref 150–400)
RBC: 1.97 MIL/uL — AB (ref 3.87–5.11)
RDW: 21.3 % — ABNORMAL HIGH (ref 11.5–15.5)
WBC: 3.4 10*3/uL — ABNORMAL LOW (ref 4.0–10.5)

## 2015-05-30 LAB — HEMOGLOBIN AND HEMATOCRIT, BLOOD
HEMATOCRIT: 25.6 % — AB (ref 36.0–46.0)
HEMOGLOBIN: 8.8 g/dL — AB (ref 12.0–15.0)

## 2015-05-30 LAB — HEPATIC FUNCTION PANEL
ALBUMIN: 2 g/dL — AB (ref 3.5–5.0)
ALT: 38 U/L (ref 14–54)
AST: 74 U/L — AB (ref 15–41)
Alkaline Phosphatase: 130 U/L — ABNORMAL HIGH (ref 38–126)
BILIRUBIN TOTAL: 12.5 mg/dL — AB (ref 0.3–1.2)
Bilirubin, Direct: 6.8 mg/dL — ABNORMAL HIGH (ref 0.1–0.5)
Indirect Bilirubin: 5.7 mg/dL — ABNORMAL HIGH (ref 0.3–0.9)
Total Protein: 5.3 g/dL — ABNORMAL LOW (ref 6.5–8.1)

## 2015-05-30 LAB — PREPARE RBC (CROSSMATCH)

## 2015-05-30 NOTE — Progress Notes (Signed)
  Subjective:  Patient seen during dialysis Tolerating well  decannulated NGT in place UF goal 3000 cc  Objective:  Vital signs in last 24 hours:  Temperature 97.1 pulse 74 respirations 15 blood pressure 101/43     Physical Exam: General: No acute distress  HEENT Eyes open, NG in place  Neck supple  Pulm/lungs Bilateral rhonchi, on vent  CVS/Heart S1S2, no rub  Abdomen:  Distended from ascites BS present  Extremities: 2+ bilateral lower extremity edema  Neurologic: Awake, alert, follows commands.  Skin: No acute rashes  Access: Rt IJ temp  Cath VIR 12/27   Foley, rectal tube    Basic Metabolic Panel:   Recent Labs Lab 05/25/15 0800 05/28/15 0521 05/30/15 0613  NA 136  137 137 136  K 3.5  3.6 4.3 4.3  CL 99*  100* 103 103  CO2 23  24 20* 25  GLUCOSE 169*  168* 171* 71  BUN 59*  58* 77* 59*  CREATININE 5.43*  5.34* 5.87* 5.43*  CALCIUM 8.2*  8.2* 7.7* 8.2*  PHOS 6.6* 7.4* 6.8*     CBC:  Recent Labs Lab 05/25/15 0800 05/28/15 0458 05/30/15 0613  WBC 3.8* 4.2 3.4*  HGB 7.7* 7.0* 6.8*  HCT 23.2* 21.1* 20.3*  MCV 100.9* 101.0* 103.0*  PLT 33* 37* 31*      Microbiology:  Recent Results (from the past 720 hour(s))  C difficile quick scan w PCR reflex     Status: Abnormal   Collection Time: 05/19/15  2:30 AM  Result Value Ref Range Status   C Diff antigen POSITIVE (A) NEGATIVE Final   C Diff toxin POSITIVE (A) NEGATIVE Final   C Diff interpretation Positive for toxigenic C. difficile  Final    Comment: CRITICAL RESULT CALLED TO, READ BACK BY AND VERIFIED WITH: C MASEKO  05/19/15 MKELLY     Coagulation Studies: No results for input(s): LABPROT, INR in the last 72 hours.  Urinalysis: No results for input(s): COLORURINE, LABSPEC, PHURINE, GLUCOSEU, HGBUR, BILIRUBINUR, KETONESUR, PROTEINUR, UROBILINOGEN, NITRITE, LEUKOCYTESUR in the last 72 hours.  Invalid input(s): APPERANCEUR    Imaging: No results found.   Medications:        Assessment/ Plan:  65 y.o. female with a PMHx of diabetes mellitus, liver cirrhosis,chronic kidney disease stage IV baseline creatinine 2.8, thyroid disease, bipolar disorder, GERD, recurrent urinary tract infections, chronic pancytopenia, who was admitted to [facility] on 04/21/2015 for ongoing treatment of acute respiratory failure, liver cirrhosis, acute renal failure.  1. Acute renal failure. ? Hepatorenal, vs Vanc toxicity, vs diuresis. ? Progressed to ESRD 2. Chronic kidney disease stage IV baseline creatinine 2.8. 3. Anasarca 4. Acute respiratory failure- decanulated now. 5. Liver cirrhosis with ascites 6. Thrombocytopenia. Pack catheter with citrate 7.  Anemia of CKD. 8. C Diff colitis 05/19/15  Plan: Overall renal function remains poor.  She may end up progressing to end-stage renal disease. Patient has been started on Aranesp per protocol. Hemoglobin currently 6.8. We will continue to perform dialysis on Monday, Wednesday, Friday schedule. Blood transfusion and iv albumin with HD today  LOS:  Bethany [name] 1/18/20173:33 PM

## 2015-05-31 DIAGNOSIS — R7989 Other specified abnormal findings of blood chemistry: Secondary | ICD-10-CM

## 2015-05-31 DIAGNOSIS — R945 Abnormal results of liver function studies: Secondary | ICD-10-CM | POA: Insufficient documentation

## 2015-05-31 NOTE — Progress Notes (Signed)
PULMONARY / CRITICAL CARE MEDICINE   Name: Bethany [name] MRN: [id number] DOB: [birth year]    ADMISSION DATE:  05/12/2015 CONSULTATION DATE:  05/07/2015  REFERRING MD:  Sharyon Medicus  CHIEF COMPLAINT:  Short of breath  SUBJECTIVE:  Decannulated. No distress  VITAL SIGNS: sats 100 hr 86 rr 28 bp 103/58   PHYSICAL EXAMINATION: General:  Jaundice, no  Distress.  Neuro:  Awake and follows commands HEENT: Trach site dressing intact  Cardiovascular:  regular Lungs: no wheeze Abdomen:  Soft, mild distention Musculoskeletal:  1+ edema Skin:  Multiple areas of ecchymosis  LABS:  BMET  Recent Labs Lab 05/25/15 0800 05/28/15 0521 05/30/15 0613  NA 136  137 137 136  K 3.5  3.6 4.3 4.3  CL 99*  100* 103 103  CO2 23  24 20* 25  BUN 59*  58* 77* 59*  CREATININE 5.43*  5.34* 5.87* 5.43*  GLUCOSE 169*  168* 171* 71    Electrolytes  Recent Labs Lab 05/25/15 0800 05/28/15 0521 05/30/15 0613  CALCIUM 8.2*  8.2* 7.7* 8.2*  PHOS 6.6* 7.4* 6.8*    CBC  Recent Labs Lab 05/25/15 0800 05/28/15 0458 05/30/15 0613 05/30/15 1938  WBC 3.8* 4.2 3.4*  --   HGB 7.7* 7.0* 6.8* 8.8*  HCT 23.2* 21.1* 20.3* 25.6*  PLT 33* 37* 31*  --     Liver Enzymes  Recent Labs Lab 05/25/15 0800 05/28/15 0521 05/30/15 0613  AST 78*  --  74*  ALT 42  --  38  ALKPHOS 183*  --  130*  BILITOT 10.3*  --  12.5*  ALBUMIN 2.1*  2.0* 2.1* 2.0*  2.0*    Imaging No results found.  STUDIES:  12/26 CT abd/pelvis >> ascites, cirrhosis, splenomegaly  SIGNIFICANT EVENTS: 12/20 Transfer from [place] to [facility] 01/03 Trached per ENT, Dr. Ezzard Standing  LINES/TUBES: 12/27 Rt IJ HD cath >>  DISCUSSION: 65 yo female with hypervolemia and respiratory failure in setting of Influenza B PNA, new onset seizures, ESRD and cirrhosis.  She also has hx of DM, Hypothyroidism, Bipolar disease. We down-sized her to #4 and began trach capping trials on 1/117. Now decannulated   ASSESSMENT / PLAN:  Acute  respiratory failure. Failure to wean from ventilator s/p tracheostomy-->now greatly improved.  Plan: Cont pulm hygiene  Wean O2 PCCM s/o    ESRD. Plan: - HD per renal  Cirrhosis. Chronic thrombocytopenia. ACD Seizures. Plan: - per primary team  C diff positive 1/07. Plan: - flagyl per primary team  Simonne Martinet ACNP-BC [facility] Pulmonary/Critical Care Pager # [phone] OR # [phone] if no answer  Simonne Martinet, NP [phone]  STAFF NOTE: I, Rory Percy, MD FACP have personally reviewed patient's available data, including medical history, events of note, physical examination and test results as part of my evaluation. I have discussed with resident/NP and other care providers such as pharmacist, RN and RRT. In addition, I personally evaluated patient and elicited key findings of: slight ronchi on examination chest, trach wound is wide open with leak after 24 hrs decannulation, I am concerned for wound healing, keep bid dressing, no showers , no swimming, if by Monday she still has leak and poor seal would suggest other means of sealing, sorbiview etc, can call us back, have concerns with her Bili, per primary, id she has reoccurent resp failure, repeat trach and no deannualtion in future  [employer]. Tyson Alias, MD, FACP Pgr: [phone] [place] [facility] 05/31/2015 9:59 PM

## 2015-06-01 LAB — RENAL FUNCTION PANEL
ALBUMIN: 2 g/dL — AB (ref 3.5–5.0)
Anion gap: 13 (ref 5–15)
BUN: 50 mg/dL — AB (ref 6–20)
CALCIUM: 8.3 mg/dL — AB (ref 8.9–10.3)
CHLORIDE: 96 mmol/L — AB (ref 101–111)
CO2: 23 mmol/L (ref 22–32)
CREATININE: 5.15 mg/dL — AB (ref 0.44–1.00)
GFR, EST AFRICAN AMERICAN: 9 mL/min — AB (ref >60)
GFR, EST NON AFRICAN AMERICAN: 8 mL/min — AB (ref >60)
Glucose, Bld: 103 mg/dL — ABNORMAL HIGH (ref 65–99)
PHOSPHORUS: 5.5 mg/dL — AB (ref 2.5–4.6)
Potassium: 3.9 mmol/L (ref 3.5–5.1)
SODIUM: 132 mmol/L — AB (ref 135–145)

## 2015-06-01 LAB — CBC
HCT: 22.3 % — ABNORMAL LOW (ref 36.0–46.0)
Hemoglobin: 7.6 g/dL — ABNORMAL LOW (ref 12.0–15.0)
MCH: 33.5 pg (ref 26.0–34.0)
MCHC: 34.1 g/dL (ref 30.0–36.0)
MCV: 98.2 fL (ref 78.0–100.0)
PLATELETS: 45 10*3/uL — AB (ref 150–400)
RBC: 2.27 MIL/uL — AB (ref 3.87–5.11)
RDW: 25.4 % — AB (ref 11.5–15.5)
WBC: 5.3 10*3/uL (ref 4.0–10.5)

## 2015-06-01 NOTE — Progress Notes (Addendum)
  Subjective:  Patient is doing fair Awaiting dialysis later today decannulated NGT in place   Objective:  Vital signs in last 24 hours:  Temperature 98.5 pulse 110 respirations 19 blood pressure 117/47     Physical Exam: General: No acute distress  HEENT Eyes open, + icterus  Neck Supple, bandage over trach site  Pulm/lungs Normal effort  CVS/Heart S1S2, no rub  Abdomen:  Distended from ascites BS present  Extremities: 2+ bilateral lower extremity edema  Neurologic: Awake, alert, follows commands.  Skin: No acute rashes  Access: Rt IJ temp  Cath VIR 12/27   Foley, rectal tube    Basic Metabolic Panel:   Recent Labs Lab 05/28/15 0521 05/30/15 0613 06/01/15 0750  NA 137 136 132*  K 4.3 4.3 3.9  CL 103 103 96*  CO2 20* 25 23  GLUCOSE 171* 71 103*  BUN 77* 59* 50*  CREATININE 5.87* 5.43* 5.15*  CALCIUM 7.7* 8.2* 8.3*  PHOS 7.4* 6.8* 5.5*     CBC:  Recent Labs Lab 05/28/15 0458 05/30/15 0613 05/30/15 1938 06/01/15 0715  WBC 4.2 3.4*  --  5.3  HGB 7.0* 6.8* 8.8* 7.6*  HCT 21.1* 20.3* 25.6* 22.3*  MCV 101.0* 103.0*  --  98.2  PLT 37* 31*  --  45*      Microbiology:  Recent Results (from the past 720 hour(s))  C difficile quick scan w PCR reflex     Status: Abnormal   Collection Time: 05/19/15  2:30 AM  Result Value Ref Range Status   C Diff antigen POSITIVE (A) NEGATIVE Final   C Diff toxin POSITIVE (A) NEGATIVE Final   C Diff interpretation Positive for toxigenic C. difficile  Final    Comment: CRITICAL RESULT CALLED TO, READ BACK BY AND VERIFIED WITH: C MASEKO  05/19/15 MKELLY     Coagulation Studies: No results for input(s): LABPROT, INR in the last 72 hours.  Urinalysis: No results for input(s): COLORURINE, LABSPEC, PHURINE, GLUCOSEU, HGBUR, BILIRUBINUR, KETONESUR, PROTEINUR, UROBILINOGEN, NITRITE, LEUKOCYTESUR in the last 72 hours.  Invalid input(s): APPERANCEUR    Imaging: No results found.   Medications:    aranesp 64  mcg weekly   Assessment/ Plan:  65 y.o. female with a PMHx of diabetes mellitus, liver cirrhosis,chronic kidney disease stage IV baseline creatinine 2.8, thyroid disease, bipolar disorder, GERD, recurrent urinary tract infections, chronic pancytopenia, who was admitted to [facility] on 05/26/15 for ongoing treatment of acute respiratory failure, liver cirrhosis, acute renal failure. Started HD 12/27  1. Acute renal failure. ? Hepatorenal, vs Vanc toxicity, vs diuresis. ? Progressed to ESRD 2. Chronic kidney disease stage IV baseline creatinine 2.8. 3. Anasarca 4. Acute respiratory failure- decanulated now. 5. Liver cirrhosis with ascites 6. Thrombocytopenia. Pack catheter with citrate 7.  Anemia of CKD. 8. C Diff colitis 05/19/15  Plan: Overall renal function remains poor.  She may end up progressing to end-stage renal disease. Patient has been started on Aranesp per protocol. Hemoglobin currently 6.8. We will continue to perform dialysis on Monday, Wednesday, Friday schedule. iv albumin with HD today  LOS:  Tsuruko Murtha 1/20/20178:59 AM

## 2015-06-03 LAB — TYPE AND SCREEN
ABO/RH(D): A POS
Antibody Screen: POSITIVE
DAT, IgG: NEGATIVE
DONOR AG TYPE: NEGATIVE
DONOR AG TYPE: NEGATIVE
UNIT DIVISION: 0
UNIT DIVISION: 0

## 2015-06-04 LAB — RENAL FUNCTION PANEL
ALBUMIN: 2.1 g/dL — AB (ref 3.5–5.0)
ANION GAP: 15 (ref 5–15)
BUN: 65 mg/dL — ABNORMAL HIGH (ref 6–20)
CO2: 22 mmol/L (ref 22–32)
Calcium: 8.2 mg/dL — ABNORMAL LOW (ref 8.9–10.3)
Chloride: 93 mmol/L — ABNORMAL LOW (ref 101–111)
Creatinine, Ser: 6.45 mg/dL — ABNORMAL HIGH (ref 0.44–1.00)
GFR calc Af Amer: 7 mL/min — ABNORMAL LOW (ref >60)
GFR calc non Af Amer: 6 mL/min — ABNORMAL LOW (ref >60)
Glucose, Bld: 181 mg/dL — ABNORMAL HIGH (ref 65–99)
Phosphorus: 7.7 mg/dL — ABNORMAL HIGH (ref 2.5–4.6)
Potassium: 4.3 mmol/L (ref 3.5–5.1)
SODIUM: 130 mmol/L — AB (ref 135–145)

## 2015-06-04 LAB — CBC
HCT: 25.4 % — ABNORMAL LOW (ref 36.0–46.0)
HEMOGLOBIN: 8.6 g/dL — AB (ref 12.0–15.0)
MCH: 33.1 pg (ref 26.0–34.0)
MCHC: 33.9 g/dL (ref 30.0–36.0)
MCV: 97.7 fL (ref 78.0–100.0)
PLATELETS: 70 10*3/uL — AB (ref 150–400)
RBC: 2.6 MIL/uL — ABNORMAL LOW (ref 3.87–5.11)
RDW: 24 % — ABNORMAL HIGH (ref 11.5–15.5)
WBC: 9.6 10*3/uL (ref 4.0–10.5)

## 2015-06-04 NOTE — Progress Notes (Signed)
  Subjective:  Patient is doing fair Patient seen during dialysis Tolerating well    Objective:  Vital signs in last 24 hours:  Temperature 97.1 pulse 110 respirations 23 blood pressure 105/48     Physical Exam: General: No acute distress,  HEENT Eyes open, + icterus  Neck Supple, bandage over trach site  Pulm/lungs Normal effort  CVS/Heart S1S2, no rub  Abdomen:  Distended from ascites BS present  Extremities: 2+ bilateral lower extremity edema  Neurologic: Awake, alert, follows commands.  Skin: No acute rashes, generalized icterus  Access: Rt IJ temp  Cath VIR 12/27        Basic Metabolic Panel:   Recent Labs Lab 05/30/15 0613 06/01/15 0750 06/04/15 1100  NA 136 132* 130*  K 4.3 3.9 4.3  CL 103 96* 93*  CO2 GLUCOSE 71 103* 181*  BUN 59* 50* 65*  CREATININE 5.43* 5.15* 6.45*  CALCIUM 8.2* 8.3* 8.2*  PHOS 6.8* 5.5* 7.7*     CBC:  Recent Labs Lab 05/30/15 0613 05/30/15 1938 06/01/15 0715 06/04/15 1100  WBC 3.4*  --  5.3 9.6  HGB 6.8* 8.8* 7.6* 8.6*  HCT 20.3* 25.6* 22.3* 25.4*  MCV 103.0*  --  98.2 97.7  PLT 31*  --  45* 70*      Microbiology:  Recent Results (from the past 720 hour(s))  C difficile quick scan w PCR reflex     Status: Abnormal   Collection Time: 05/19/15  2:30 AM  Result Value Ref Range Status   C Diff antigen POSITIVE (A) NEGATIVE Final   C Diff toxin POSITIVE (A) NEGATIVE Final   C Diff interpretation Positive for toxigenic C. difficile  Final    Comment: CRITICAL RESULT CALLED TO, READ BACK BY AND VERIFIED WITH: C MASEKO  05/19/15 MKELLY     Coagulation Studies: No results for input(s): LABPROT, INR in the last 72 hours.  Urinalysis: No results for input(s): COLORURINE, LABSPEC, PHURINE, GLUCOSEU, HGBUR, BILIRUBINUR, KETONESUR, PROTEINUR, UROBILINOGEN, NITRITE, LEUKOCYTESUR in the last 72 hours.  Invalid input(s): APPERANCEUR    Imaging: No results found.   Medications:    aranesp 64 mcg  weekly   Assessment/ Plan:  65 y.o. female with a PMHx of diabetes mellitus, liver cirrhosis with portal HTN ,chronic kidney disease stage IV baseline creatinine 2.8, thyroid disease, bipolar disorder, GERD, recurrent urinary tract infections, chronic pancytopenia, who was admitted to [facility] on 04/15/2015 for ongoing treatment of acute respiratory failure, liver cirrhosis, acute renal failure. Started HD 12/27  1. Progressed to ESRD- cause likely Hepatorenal, vs Vanc toxicity 2. Anasarca  3. Liver cirrhosis with ascites ? cause 4. Acute respiratory failure- decanulated now. 5. C Diff colitis 05/19/15 6. Anemia of CKD. 7. Thrombocytopenia. Pack catheter with citrate   Plan: Overall renal function remains poor.  Patient has been started on Aranesp per protocol. Hemoglobin currently 8.6. We will continue to perform dialysis on Monday, Wednesday, Friday schedule. iv albumin with HD  Approx 3 L removed Overall prognosis poor   LOS:  Bethany [name] 1/23/20174:23 PM

## 2015-06-06 LAB — CBC
HEMATOCRIT: 23.1 % — AB (ref 36.0–46.0)
HEMOGLOBIN: 7.9 g/dL — AB (ref 12.0–15.0)
MCH: 34.2 pg — ABNORMAL HIGH (ref 26.0–34.0)
MCHC: 34.2 g/dL (ref 30.0–36.0)
MCV: 100 fL (ref 78.0–100.0)
Platelets: 47 10*3/uL — ABNORMAL LOW (ref 150–400)
RBC: 2.31 MIL/uL — ABNORMAL LOW (ref 3.87–5.11)
RDW: 24.1 % — AB (ref 11.5–15.5)
WBC: 5.9 10*3/uL (ref 4.0–10.5)

## 2015-06-06 LAB — RENAL FUNCTION PANEL
ANION GAP: 14 (ref 5–15)
Albumin: 2 g/dL — ABNORMAL LOW (ref 3.5–5.0)
BUN: 53 mg/dL — AB (ref 6–20)
CHLORIDE: 95 mmol/L — AB (ref 101–111)
CO2: 23 mmol/L (ref 22–32)
Calcium: 8.3 mg/dL — ABNORMAL LOW (ref 8.9–10.3)
Creatinine, Ser: 5.62 mg/dL — ABNORMAL HIGH (ref 0.44–1.00)
GFR calc Af Amer: 8 mL/min — ABNORMAL LOW (ref >60)
GFR, EST NON AFRICAN AMERICAN: 7 mL/min — AB (ref >60)
Glucose, Bld: 158 mg/dL — ABNORMAL HIGH (ref 65–99)
PHOSPHORUS: 8 mg/dL — AB (ref 2.5–4.6)
POTASSIUM: 3.8 mmol/L (ref 3.5–5.1)
Sodium: 132 mmol/L — ABNORMAL LOW (ref 135–145)

## 2015-06-06 NOTE — Progress Notes (Signed)
  Subjective:  Patient is doing fair Awaiting HD for today 3000 cc of fluid was removed on Monday   Objective:  Vital signs in last 24 hours:  Temperature 97.4 pulse 96 respirations 21 blood pressure 108/67     Physical Exam: General: No acute distress,  HEENT Eyes open, + icterus  Neck Supple,   Pulm/lungs Normal effort  CVS/Heart S1S2, no rub  Abdomen:  Distended from ascites BS present  Extremities: 2+ bilateral lower extremity edema  Neurologic: Awake, alert, follows commands.  Skin: No acute rashes, generalized icterus  Access: Rt IJ temp  Cath VIR 12/27        Basic Metabolic Panel:   Recent Labs Lab 06/01/15 0750 06/04/15 1100 06/06/15 0736  NA 132* 130* 132*  K 3.9 4.3 3.8  CL 96* 93* 95*  CO2 GLUCOSE 103* 181* 158*  BUN 50* 65* 53*  CREATININE 5.15* 6.45* 5.62*  CALCIUM 8.3* 8.2* 8.3*  PHOS 5.5* 7.7* 8.0*     CBC:  Recent Labs Lab 05/30/15 1938 06/01/15 0715 06/04/15 1100 06/06/15 0736  WBC  --  5.3 9.6 5.9  HGB 8.8* 7.6* 8.6* 7.9*  HCT 25.6* 22.3* 25.4* 23.1*  MCV  --  98.2 97.7 100.0  PLT  --  45* 70* 47*      Microbiology:  Recent Results (from the past 720 hour(s))  C difficile quick scan w PCR reflex     Status: Abnormal   Collection Time: 05/19/15  2:30 AM  Result Value Ref Range Status   C Diff antigen POSITIVE (A) NEGATIVE Final   C Diff toxin POSITIVE (A) NEGATIVE Final   C Diff interpretation Positive for toxigenic C. difficile  Final    Comment: CRITICAL RESULT CALLED TO, READ BACK BY AND VERIFIED WITH: C MASEKO  05/19/15 MKELLY     Coagulation Studies: No results for input(s): LABPROT, INR in the last 72 hours.  Urinalysis: No results for input(s): COLORURINE, LABSPEC, PHURINE, GLUCOSEU, HGBUR, BILIRUBINUR, KETONESUR, PROTEINUR, UROBILINOGEN, NITRITE, LEUKOCYTESUR in the last 72 hours.  Invalid input(s): APPERANCEUR    Imaging: No results found.   Medications:    aranesp 64 mcg  weekly   Assessment/ Plan:  64 y.o. female with a PMHx of diabetes mellitus, liver cirrhosis with portal HTN ,chronic kidney disease stage IV baseline creatinine 2.8, thyroid disease, bipolar disorder, GERD, recurrent urinary tract infections, chronic pancytopenia, who was admitted to [facility] on 04/17/2015 for ongoing treatment of acute respiratory failure, liver cirrhosis, acute renal failure. Started HD 12/27  1. Progressed to ESRD- cause likely Hepatorenal, vs Vanc toxicity 2. Anasarca  3. Liver cirrhosis with ascites ? cause 4. Acute respiratory failure- decanulated now. 5. C Diff colitis 05/19/15 6. Anemia of CKD. 7. Thrombocytopenia. Pack catheter with citrate   Plan: Overall renal function remains poor.  Patient has been started on Aranesp per protocol. Hemoglobin currently 7.9.  We will continue to perform dialysis on Monday, Wednesday, Friday schedule. iv albumin with HD  Overall prognosis poor   LOS:  Bethany [name] 1/25/20175:16 PM

## 2015-06-08 LAB — RENAL FUNCTION PANEL
ALBUMIN: 2.1 g/dL — AB (ref 3.5–5.0)
ANION GAP: 9 (ref 5–15)
BUN: 24 mg/dL — ABNORMAL HIGH (ref 6–20)
CHLORIDE: 99 mmol/L — AB (ref 101–111)
CO2: 26 mmol/L (ref 22–32)
Calcium: 8.5 mg/dL — ABNORMAL LOW (ref 8.9–10.3)
Creatinine, Ser: 3.32 mg/dL — ABNORMAL HIGH (ref 0.44–1.00)
GFR calc Af Amer: 16 mL/min — ABNORMAL LOW (ref >60)
GFR, EST NON AFRICAN AMERICAN: 14 mL/min — AB (ref >60)
Glucose, Bld: 78 mg/dL (ref 65–99)
PHOSPHORUS: 6.8 mg/dL — AB (ref 2.5–4.6)
POTASSIUM: 3.4 mmol/L — AB (ref 3.5–5.1)
Sodium: 134 mmol/L — ABNORMAL LOW (ref 135–145)

## 2015-06-08 LAB — CBC
HEMATOCRIT: 21.7 % — AB (ref 36.0–46.0)
HEMOGLOBIN: 7.2 g/dL — AB (ref 12.0–15.0)
MCH: 33.8 pg (ref 26.0–34.0)
MCHC: 33.2 g/dL (ref 30.0–36.0)
MCV: 101.9 fL — AB (ref 78.0–100.0)
Platelets: 35 10*3/uL — ABNORMAL LOW (ref 150–400)
RBC: 2.13 MIL/uL — ABNORMAL LOW (ref 3.87–5.11)
RDW: 24.1 % — AB (ref 11.5–15.5)
WBC: 5.3 10*3/uL (ref 4.0–10.5)

## 2015-06-08 LAB — HEPATITIS B SURFACE ANTIGEN: HEP B S AG: NEGATIVE

## 2015-06-08 LAB — HEPARIN INDUCED PLATELET AB (HIT ANTIBODY): HEPARIN INDUCED PLT AB: 1.825 {OD_unit} — AB (ref 0.000–0.400)

## 2015-06-08 NOTE — Progress Notes (Signed)
  Subjective:  Patient is doing fair HD schedule changed to TTS 2500 cc of fluid was removed thu with iv albumin support   Objective:  Vital signs in last 24 hours:  Temperature 98.5 pulse 110 respirations 19 blood pressure 117/47     Physical Exam: General: No acute distress,  HEENT  + icterus  Neck Supple,   Pulm/lungs Normal effort  CVS/Heart S1S2, no rub  Abdomen:  Distended from ascites BS present  Extremities: + bilateral lower extremity edema  Neurologic: Awake, alert, follows commands.  Skin: No acute rashes, generalized icterus  Access: Rt IJ temp  Cath VIR 12/27        Basic Metabolic Panel:   Recent Labs Lab 06/04/15 1100 06/06/15 0736 06/08/15 0657  NA 130* 132* 134*  K 4.3 3.8 3.4*  CL 93* 95* 99*  CO2 GLUCOSE 181* 158* 78  BUN 65* 53* 24*  CREATININE 6.45* 5.62* 3.32*  CALCIUM 8.2* 8.3* 8.5*  PHOS 7.7* 8.0* 6.8*     CBC:  Recent Labs Lab 06/04/15 1100 06/06/15 0736 06/08/15 0657  WBC 9.6 5.9 5.3  HGB 8.6* 7.9* 7.2*  HCT 25.4* 23.1* 21.7*  MCV 97.7 100.0 101.9*  PLT 70* 47* 35*      Microbiology:  Recent Results (from the past 720 hour(s))  C difficile quick scan w PCR reflex     Status: Abnormal   Collection Time: 05/19/15  2:30 AM  Result Value Ref Range Status   C Diff antigen POSITIVE (A) NEGATIVE Final   C Diff toxin POSITIVE (A) NEGATIVE Final   C Diff interpretation Positive for toxigenic C. difficile  Final    Comment: CRITICAL RESULT CALLED TO, READ BACK BY AND VERIFIED WITH: C MASEKO  05/19/15 MKELLY     Coagulation Studies: No results for input(s): LABPROT, INR in the last 72 hours.  Urinalysis: No results for input(s): COLORURINE, LABSPEC, PHURINE, GLUCOSEU, HGBUR, BILIRUBINUR, KETONESUR, PROTEINUR, UROBILINOGEN, NITRITE, LEUKOCYTESUR in the last 72 hours.  Invalid input(s): APPERANCEUR    Imaging: No results found.   Medications:    aranesp 70 mcg weekly   Assessment/ Plan:  65  y.o. female with a PMHx of diabetes mellitus, liver cirrhosis with portal HTN ,chronic kidney disease stage IV baseline creatinine 2.8, thyroid disease, bipolar disorder, GERD, recurrent urinary tract infections, chronic pancytopenia, who was admitted to [facility] on 2015-05-21 for ongoing treatment of acute respiratory failure, liver cirrhosis, acute renal failure. Started HD 12/27  1. Progressed to ESRD- cause likely Hepatorenal, vs Vanc toxicity 2. Anasarca  3. Liver cirrhosis with ascites ? cause 4. Acute respiratory failure- decanulated now. 5. C Diff colitis 05/19/15 6. Anemia of CKD. 7. Thrombocytopenia. Pack catheter with citrate [heparin antibody result pending]   Plan: Overall renal function remains poor.  Patient has been started on Aranesp per protocol. Hemoglobin currently 7.2.  We will continue to perform dialysis on TTS schedule. iv albumin with HD  Overall prognosis poor   LOS:  Bethany [name] 1/27/20179:14 AM

## 2015-06-09 LAB — RENAL FUNCTION PANEL
Albumin: 2.2 g/dL — ABNORMAL LOW (ref 3.5–5.0)
Anion gap: 15 (ref 5–15)
BUN: 37 mg/dL — AB (ref 6–20)
CHLORIDE: 96 mmol/L — AB (ref 101–111)
CO2: 24 mmol/L (ref 22–32)
Calcium: 8.9 mg/dL (ref 8.9–10.3)
Creatinine, Ser: 4.47 mg/dL — ABNORMAL HIGH (ref 0.44–1.00)
GFR calc Af Amer: 11 mL/min — ABNORMAL LOW (ref >60)
GFR, EST NON AFRICAN AMERICAN: 10 mL/min — AB (ref >60)
Glucose, Bld: 105 mg/dL — ABNORMAL HIGH (ref 65–99)
POTASSIUM: 4.4 mmol/L (ref 3.5–5.1)
Phosphorus: 7.1 mg/dL — ABNORMAL HIGH (ref 2.5–4.6)
Sodium: 135 mmol/L (ref 135–145)

## 2015-06-09 LAB — CBC
HEMATOCRIT: 24.7 % — AB (ref 36.0–46.0)
Hemoglobin: 8 g/dL — ABNORMAL LOW (ref 12.0–15.0)
MCH: 33.1 pg (ref 26.0–34.0)
MCHC: 32.4 g/dL (ref 30.0–36.0)
MCV: 102.1 fL — AB (ref 78.0–100.0)
Platelets: 50 10*3/uL — ABNORMAL LOW (ref 150–400)
RBC: 2.42 MIL/uL — ABNORMAL LOW (ref 3.87–5.11)
RDW: 23.8 % — AB (ref 11.5–15.5)
WBC: 8 10*3/uL (ref 4.0–10.5)

## 2015-06-11 ENCOUNTER — Encounter (HOSPITAL_BASED_OUTPATIENT_CLINIC_OR_DEPARTMENT_OTHER): Payer: Self-pay

## 2015-06-11 DIAGNOSIS — L988 Other specified disorders of the skin and subcutaneous tissue: Secondary | ICD-10-CM

## 2015-06-11 NOTE — Progress Notes (Signed)
VASCULAR LAB PRELIMINARY  PRELIMINARY  PRELIMINARY  PRELIMINARY  Right  Upper Extremity Vein Map    Cephalic  Segment Diameter Depth Comment  1. Axilla 1.85mm 3.67mm Thrombus  2. Mid upper arm 1.20mm 4.5mm Thrombus  3. Above [facility] 1.61mm 1.12mm Thrombus  4. In AC 2.62mm 2.53mm Thrombus  5. Below AC 2.57mm 2.95mm   6. Mid forearm 1.64mm 2.1mm   7. Wrist 1.54mm mm Depth not correct   Basilic  Segment Diameter Depth Comment  2. Mid upper arm 2.68mm 1.60mm   3. Above AC 1.59mm 14mm Branch  4. In AC 1.56mm 5.73mm Multiple branches  5. Below AC mm mm Too small  6. Mid forearm mm mm Too small  7. Wrist mm mm Too small     Left Upper Extremity Vein Map    Cephalic  Segment Diameter Depth Comment  1. Axilla mm mm Not seen  2. Mid upper arm mm mm Not seen  3. Above AC mm mm Thrombus  4. In [facility] 2.63mm 13.65mm   5. Below AC 1.15mm 4.49mm   6. Mid forearm 1.50mm 2.68mm   7. Wrist 3.5mm 6.45mm Thrombus   Basilic  Segment Diameter Depth Comment  2. Mid upper arm 2.73mm 16mm   3. Above AC mm mm Not visualized possibly too small  4. In AC mm mm Not visualized possibly too small  5. Below AC mm mm Not visualized possibly too small  6. Mid forearm mm mm Not visualized possibly too small  7. Wrist mm mm Not visualized possibly too small     Nakeshia Waldeck, RVS 06/11/2015, 6:45 PM

## 2015-06-11 NOTE — Progress Notes (Signed)
  Subjective:  Patient seen at the bedside. Due for hemodialysis tomorrow again. Outpatient planning hemodialysis is ongoing.    Objective:  Vital signs in last 24 hours:  Temperature 97.8 pulse 76 respirations 20 blood pressure 110/45    Physical Exam: General: No acute distress  HEENT  +icterus hearing intact OM moist  Neck Supple  Pulm/lungs Normal effort, CTAB  CVS/Heart S1S2, no rub  Abdomen:  Distended from ascites BS present  Extremities: + bilateral lower extremity edema  Neurologic: Awake, alert, follows commands.  Skin: No acute rashes, generalized icterus  Access: Rt IJ temp  Cath VIR 12/27        Basic Metabolic Panel:   Recent Labs Lab 06/06/15 0736 06/08/15 0657 06/09/15 0537  NA 132* 134* 135  K 3.8 3.4* 4.4  CL 95* 99* 96*  CO2 GLUCOSE 158* 78 105*  BUN 53* 24* 37*  CREATININE 5.62* 3.32* 4.47*  CALCIUM 8.3* 8.5* 8.9  PHOS 8.0* 6.8* 7.1*     CBC:  Recent Labs Lab 06/06/15 0736 06/08/15 0657 06/09/15 0537  WBC 5.9 5.3 8.0  HGB 7.9* 7.2* 8.0*  HCT 23.1* 21.7* 24.7*  MCV 100.0 101.9* 102.1*  PLT 47* 35* 50*      Microbiology:  Recent Results (from the past 720 hour(s))  C difficile quick scan w PCR reflex     Status: Abnormal   Collection Time: 05/19/15  2:30 AM  Result Value Ref Range Status   C Diff antigen POSITIVE (A) NEGATIVE Final   C Diff toxin POSITIVE (A) NEGATIVE Final   C Diff interpretation Positive for toxigenic C. difficile  Final    Comment: CRITICAL RESULT CALLED TO, READ BACK BY AND VERIFIED WITH: C MASEKO  05/19/15 MKELLY     Coagulation Studies: No results for input(s): LABPROT, INR in the last 72 hours.  Urinalysis: No results for input(s): COLORURINE, LABSPEC, PHURINE, GLUCOSEU, HGBUR, BILIRUBINUR, KETONESUR, PROTEINUR, UROBILINOGEN, NITRITE, LEUKOCYTESUR in the last 72 hours.  Invalid input(s): APPERANCEUR    Imaging: No results found.   Medications:    aranesp 70 mcg  weekly   Assessment/ Plan:  65 y.o. female with a PMHx of diabetes mellitus, liver cirrhosis with portal HTN ,chronic kidney disease stage IV baseline creatinine 2.8, thyroid disease, bipolar disorder, GERD, recurrent urinary tract infections, chronic pancytopenia, who was admitted to [facility] on 05/06/2015 for ongoing treatment of acute respiratory failure, liver cirrhosis, acute renal failure. Started HD 12/27  1. Progressed to ESRD- cause likely Hepatorenal, vs Vanc toxicity 2. Anasarca  3. Liver cirrhosis with ascites ? cause 4. Acute respiratory failure- decanulated now. 5. C Diff colitis 05/19/15 6. Anemia of CKD. 7. Thrombocytopenia. Pack catheter with citrate [heparin antibody result pending]   Plan: Patient due for hemodialysis tomorrow. We will put her orders for tomorrow with ultrafiltration target of 2 kg. Continue to use albumin for blood pressure support. Hemoglobin currently 8.0. Continue Aranesp per protocol. Will also continue to pack the catheter with citrate however, thrombocytopenia related to liver disease.  LOS:  Bethany [name] 1/30/20173:25 PM

## 2015-06-12 ENCOUNTER — Other Ambulatory Visit (HOSPITAL_COMMUNITY): Payer: Self-pay

## 2015-06-12 LAB — CBC
HEMATOCRIT: 25.5 % — AB (ref 36.0–46.0)
HEMOGLOBIN: 8.4 g/dL — AB (ref 12.0–15.0)
MCH: 33.5 pg (ref 26.0–34.0)
MCHC: 32.9 g/dL (ref 30.0–36.0)
MCV: 101.6 fL — AB (ref 78.0–100.0)
Platelets: 70 10*3/uL — ABNORMAL LOW (ref 150–400)
RBC: 2.51 MIL/uL — AB (ref 3.87–5.11)
RDW: 23 % — ABNORMAL HIGH (ref 11.5–15.5)
WBC: 11.1 10*3/uL — AB (ref 4.0–10.5)

## 2015-06-12 LAB — RENAL FUNCTION PANEL
ANION GAP: 14 (ref 5–15)
Albumin: 2.3 g/dL — ABNORMAL LOW (ref 3.5–5.0)
BUN: 46 mg/dL — ABNORMAL HIGH (ref 6–20)
CHLORIDE: 96 mmol/L — AB (ref 101–111)
CO2: 23 mmol/L (ref 22–32)
Calcium: 8.6 mg/dL — ABNORMAL LOW (ref 8.9–10.3)
Creatinine, Ser: 5.56 mg/dL — ABNORMAL HIGH (ref 0.44–1.00)
GFR, EST AFRICAN AMERICAN: 9 mL/min — AB (ref >60)
GFR, EST NON AFRICAN AMERICAN: 7 mL/min — AB (ref >60)
Glucose, Bld: 95 mg/dL (ref 65–99)
POTASSIUM: 4.2 mmol/L (ref 3.5–5.1)
Phosphorus: 7.8 mg/dL — ABNORMAL HIGH (ref 2.5–4.6)
Sodium: 133 mmol/L — ABNORMAL LOW (ref 135–145)

## 2015-06-13 ENCOUNTER — Other Ambulatory Visit (HOSPITAL_COMMUNITY): Payer: Self-pay

## 2015-06-13 LAB — HEMOGLOBIN AND HEMATOCRIT, BLOOD
HEMATOCRIT: 24.8 % — AB (ref 36.0–46.0)
Hemoglobin: 8.1 g/dL — ABNORMAL LOW (ref 12.0–15.0)

## 2015-06-13 LAB — EXPECTORATED SPUTUM ASSESSMENT W REFEX TO RESP CULTURE

## 2015-06-13 LAB — EXPECTORATED SPUTUM ASSESSMENT W GRAM STAIN, RFLX TO RESP C

## 2015-06-13 NOTE — Progress Notes (Signed)
Subjective:  Patient had vein mapping performed yesterday. Dialysis was performed yesterday as well. Due for dialysis again tomorrow.  Objective:  Vital signs in last 24 hours:  Temperature 97.3 pulse 80 respirations 25 blood pressure 108/46 pulse ox 95%    Physical Exam: General: No acute distress  HEENT  +icterus hearing intact OM moist  Neck Supple  Pulm/lungs Normal effort, CTAB  CVS/Heart S1S2, no rub  Abdomen:  Distended from ascites BS present  Extremities: + bilateral lower extremity edema  Neurologic: Awake, alert, follows commands.  Skin: No acute rashes, generalized icterus  Access: Rt IJ temp  Cath VIR 12/27        Basic Metabolic Panel:   Recent Labs Lab 06/08/15 0657 06/09/15 0537 06/12/15 0833  NA 134* 135 133*  K 3.4* 4.4 4.2  CL 99* 96* 96*  CO2 GLUCOSE 78 105* 95  BUN 24* 37* 46*  CREATININE 3.32* 4.47* 5.56*  CALCIUM 8.5* 8.9 8.6*  PHOS 6.8* 7.1* 7.8*     CBC:  Recent Labs Lab 06/08/15 0657 06/09/15 0537 06/12/15 0812 06/13/15 0553  WBC 5.3 8.0 11.1*  --   HGB 7.2* 8.0* 8.4* 8.1*  HCT 21.7* 24.7* 25.5* 24.8*  MCV 101.9* 102.1* 101.6*  --   PLT 35* 50* 70*  --       Microbiology:  Recent Results (from the past 720 hour(s))  C difficile quick scan w PCR reflex     Status: Abnormal   Collection Time: 05/19/15  2:30 AM  Result Value Ref Range Status   C Diff antigen POSITIVE (A) NEGATIVE Final   C Diff toxin POSITIVE (A) NEGATIVE Final   C Diff interpretation Positive for toxigenic C. difficile  Final    Comment: CRITICAL RESULT CALLED TO, READ BACK BY AND VERIFIED WITH: C MASEKO  05/19/15 MKELLY   Culture, expectorated sputum-assessment     Status: None   Collection Time: 06/13/15  1:30 PM  Result Value Ref Range Status   Specimen Description SPUTUM  Final   Special Requests NONE  Final   Sputum evaluation   Final    THIS SPECIMEN IS ACCEPTABLE. RESPIRATORY CULTURE REPORT TO FOLLOW.   Report Status  06/13/2015 FINAL  Final    Coagulation Studies: No results for input(s): LABPROT, INR in the last 72 hours.  Urinalysis: No results for input(s): COLORURINE, LABSPEC, PHURINE, GLUCOSEU, HGBUR, BILIRUBINUR, KETONESUR, PROTEINUR, UROBILINOGEN, NITRITE, LEUKOCYTESUR in the last 72 hours.  Invalid input(s): APPERANCEUR    Imaging: Ct Chest Wo Contrast  06/12/2015  CLINICAL DATA:  Right hemi thorax opacification on current chest radiograph. This is for further evaluation. EXAM: CT CHEST WITHOUT CONTRAST TECHNIQUE: Multidetector CT imaging of the chest was performed following the standard protocol without IV contrast. COMPARISON:  Current chest radiograph.  Prior chest radiographs. FINDINGS: Neck base and axilla:  No mass or adenopathy. Mediastinum and hila: Heart normal in size and configuration. No significant coronary artery calcifications. No mediastinal masses or adenopathy. Right hilum not well-defined due to contiguous atelectatic right lung. No convincing height hilar mass. Left hilum is unremarkable. Lungs and pleura: Complete atelectasis of the right lung. Right mainstem bronchus is either collapsed or occluded. There is a large right pleural effusion. This has some mass effect mildly deviating mediastinal structures to the left. Small left pleural effusion. Left lung essentially clear with no evidence of pneumonia or pulmonary edema. No mass or suspicious nodule. Limited upper abdomen: There changes of cirrhosis and portal venous hypertension  reflected by ascites and splenomegaly as well as numerous varices. Liver is small and nodular with cirrhotic morphology. Musculoskeletal: Mild degenerative changes noted along the thoracic spine. No osteoblastic or osteolytic lesions. Right internal jugular dual-lumen central venous line catheter tip just enters the right atrium. IMPRESSION: 1. Right lung is collapsed. There is a large right pleural effusion which mildly deviates the mediastinal structures to  the left. Right mainstem bronchus is either occluded or collapsed. Although a mass is not defined on the study, central obstructing lesion is possible. 2. Small left pleural effusion. No evidence of left lung pneumonia or pulmonary edema. 3. Changes of advanced cirrhosis with portal venous hypertension reflected by splenomegaly and ascites. Electronically Signed   By: Amie Portland M.D.   On: 06/12/2015 21:12   Dg Chest Port 1 View  06/12/2015  CLINICAL DATA:  Four-day history of cough. EXAM: PORTABLE CHEST 1 VIEW COMPARISON:  05/21/2015 FINDINGS: The tracheostomy tube is been removed the right IJ catheter is stable. The tip is in the right atrium. Interval development of complete opacification of the right hemi thorax possibly due to large central mucous plugging or rapidly developing pleural effusion. The left lung is relatively clear. The bony thorax is intact. IMPRESSION: Interval development of complete opacification of the right hemithorax as discussed above. These results will be called to the ordering clinician or representative by the Radiologist Assistant, and communication documented in the PACS or zVision Dashboard. Electronically Signed   By: Rudie Meyer M.D.   On: 06/12/2015 16:46     Medications:    aranesp 70 mcg weekly   Assessment/ Plan:  65 y.o. female with a PMHx of diabetes mellitus, liver cirrhosis with portal HTN ,chronic kidney disease stage IV baseline creatinine 2.8, thyroid disease, bipolar disorder, GERD, recurrent urinary tract infections, chronic pancytopenia, who was admitted to [facility] on 04/28/2015 for ongoing treatment of acute respiratory failure, liver cirrhosis, acute renal failure. Started HD 12/27  1. Progressed to ESRD- cause likely Hepatorenal, vs Vanc toxicity 2. Anasarca  3. Liver cirrhosis with ascites ? cause 4. Acute respiratory failure- decanulated now. 5. C Diff colitis 05/19/15 6. Anemia of CKD. 7. Thrombocytopenia. Pack catheter with  citrate [heparin antibody result pending]   Plan: Patient completed hemodialysis yesterday. She tolerated this well. She had vein mapping performed yesterday. Outpatient and also displacement planning on going. We will plan for dialysis tomorrow. Continues to do well off the ventilator. Hemoglobin currently 8.1 on Aranesp 70 mcg weekly. Platelets remain low at 70,000 secondary to liver disease. Continue PEG catheter with citrate.  .  LOS:  Desia Saban 2/1/20173:49 PM

## 2015-06-13 DEATH — deceased

## 2015-06-14 ENCOUNTER — Other Ambulatory Visit (HOSPITAL_COMMUNITY): Payer: Self-pay

## 2015-06-14 LAB — CBC
HCT: 24 % — ABNORMAL LOW (ref 36.0–46.0)
Hemoglobin: 8.1 g/dL — ABNORMAL LOW (ref 12.0–15.0)
MCH: 34.9 pg — ABNORMAL HIGH (ref 26.0–34.0)
MCHC: 33.8 g/dL (ref 30.0–36.0)
MCV: 103.4 fL — ABNORMAL HIGH (ref 78.0–100.0)
PLATELETS: 104 10*3/uL — AB (ref 150–400)
RBC: 2.32 MIL/uL — ABNORMAL LOW (ref 3.87–5.11)
RDW: 23 % — AB (ref 11.5–15.5)
WBC: 14.7 10*3/uL — AB (ref 4.0–10.5)

## 2015-06-14 LAB — RENAL FUNCTION PANEL
ALBUMIN: 2.1 g/dL — AB (ref 3.5–5.0)
Anion gap: 15 (ref 5–15)
BUN: 44 mg/dL — AB (ref 6–20)
CO2: 24 mmol/L (ref 22–32)
CREATININE: 4.99 mg/dL — AB (ref 0.44–1.00)
Calcium: 8.6 mg/dL — ABNORMAL LOW (ref 8.9–10.3)
Chloride: 96 mmol/L — ABNORMAL LOW (ref 101–111)
GFR calc Af Amer: 10 mL/min — ABNORMAL LOW (ref >60)
GFR, EST NON AFRICAN AMERICAN: 8 mL/min — AB (ref >60)
Glucose, Bld: 114 mg/dL — ABNORMAL HIGH (ref 65–99)
PHOSPHORUS: 8.5 mg/dL — AB (ref 2.5–4.6)
Potassium: 4.6 mmol/L (ref 3.5–5.1)
Sodium: 135 mmol/L (ref 135–145)

## 2015-06-15 ENCOUNTER — Other Ambulatory Visit (HOSPITAL_COMMUNITY): Payer: Self-pay

## 2015-06-15 LAB — CULTURE, RESPIRATORY

## 2015-06-15 LAB — CULTURE, RESPIRATORY W GRAM STAIN: Culture: NORMAL

## 2015-06-15 NOTE — Procedures (Signed)
Successful US guided right thoracentesis. Yielded 1.2L of dark yellow/orange fluid. Pt tolerated procedure well. No immediate complications.  Specimen was not sent for labs. CXR ordered.  Brayton El PA-C 06/15/2015 12:20 PM

## 2015-06-15 NOTE — Progress Notes (Signed)
Subjective:  Heparin antibodies noted to be elevated. We have been packing catheter with citrate. Patient due for hemodialysis tomorrow again. Doing well post decannulation.  Objective:  Vital signs in last 24 hours:  Temperature 97.3 pulse 80 respirations 25 blood pressure 108/46 pulse ox 95%    Physical Exam: General: No acute distress  HEENT  +icterus hearing intact OM moist  Neck Supple  Pulm/lungs Normal effort, CTAB  CVS/Heart S1S2, no rub  Abdomen:  Distended from ascites BS present  Extremities: + bilateral lower extremity edema  Neurologic: Awake, alert, follows commands.  Skin: No acute rashes, generalized icterus  Access: Rt IJ temp  Cath VIR 12/27        Basic Metabolic Panel:   Recent Labs Lab 06/09/15 0537 06/12/15 0833 06/14/15 0530  NA 135 133* 135  K 4.4 4.2 4.6  CL 96* 96* 96*  CO2 GLUCOSE 105* 95 114*  BUN 37* 46* 44*  CREATININE 4.47* 5.56* 4.99*  CALCIUM 8.9 8.6* 8.6*  PHOS 7.1* 7.8* 8.5*     CBC:  Recent Labs Lab 06/09/15 0537 06/12/15 0812 06/13/15 0553 06/14/15 0530  WBC 8.0 11.1*  --  14.7*  HGB 8.0* 8.4* 8.1* 8.1*  HCT 24.7* 25.5* 24.8* 24.0*  MCV 102.1* 101.6*  --  103.4*  PLT 50* 70*  --  104*      Microbiology:  Recent Results (from the past 720 hour(s))  C difficile quick scan w PCR reflex     Status: Abnormal   Collection Time: 05/19/15  2:30 AM  Result Value Ref Range Status   C Diff antigen POSITIVE (A) NEGATIVE Final   C Diff toxin POSITIVE (A) NEGATIVE Final   C Diff interpretation Positive for toxigenic C. difficile  Final    Comment: CRITICAL RESULT CALLED TO, READ BACK BY AND VERIFIED WITH: C MASEKO  05/19/15 MKELLY   Culture, expectorated sputum-assessment     Status: None   Collection Time: 06/13/15  1:30 PM  Result Value Ref Range Status   Specimen Description SPUTUM  Final   Special Requests NONE  Final   Sputum evaluation   Final    THIS SPECIMEN IS ACCEPTABLE. RESPIRATORY  CULTURE REPORT TO FOLLOW.   Report Status 06/13/2015 FINAL  Final  Culture, respiratory (NON-Expectorated)     Status: None (Preliminary result)   Collection Time: 06/13/15  1:30 PM  Result Value Ref Range Status   Specimen Description SPUTUM  Final   Special Requests NONE  Final   Gram Stain   Final    FEW WBC PRESENT,BOTH PMN AND MONONUCLEAR FEW SQUAMOUS EPITHELIAL CELLS PRESENT RARE GRAM POSITIVE COCCI IN PAIRS Performed at [facility]    Culture NO GROWTH Performed at [facility]   Final   Report Status PENDING  Incomplete    Coagulation Studies: No results for input(s): LABPROT, INR in the last 72 hours.  Urinalysis: No results for input(s): COLORURINE, LABSPEC, PHURINE, GLUCOSEU, HGBUR, BILIRUBINUR, KETONESUR, PROTEINUR, UROBILINOGEN, NITRITE, LEUKOCYTESUR in the last 72 hours.  Invalid input(s): APPERANCEUR    Imaging: No results found.   Medications:    aranesp 70 mcg weekly   Assessment/ Plan:  65 y.o. female with a PMHx of diabetes mellitus, liver cirrhosis with portal HTN ,chronic kidney disease stage IV baseline creatinine 2.8, thyroid disease, bipolar disorder, GERD, recurrent urinary tract infections, chronic pancytopenia, who was admitted to [facility] on 05-18-2015 for ongoing treatment of acute respiratory failure, liver cirrhosis, acute renal  failure. Started HD 12/27  1. Progressed to ESRD- cause likely Hepatorenal, vs Vanc toxicity 2. Anasarca  3. Liver cirrhosis with ascites ? cause 4. Acute respiratory failure- decanulated now. 5. C Diff colitis 05/19/15 6. Anemia of CKD. 7. Thrombocytopenia. Pack catheter with citrate [heparin antibody elevated.]  Plan: Patient due for hemodialysis again tomorrow. Ultrafiltration target will be 1.5 kg. The patient was found to have heparin antibodies and therefore we will continue pack catheter with citrate. Still has considerable edema however liver cirrhosis makes it difficult to  perform aggressive ultrafiltration. Vein mapping has been performed. Outpatient dialysis placement planning on going.  .  LOS:  Orlander Norwood 2/3/20178:38 AM

## 2015-06-16 ENCOUNTER — Other Ambulatory Visit (HOSPITAL_COMMUNITY): Payer: Self-pay

## 2015-07-11 DEATH — deceased

## 2018-01-15 IMAGING — RF DG SWALLOWING FUNCTION - NRPT [facility]
1 series · 18 of 24 positions shown · non-contrast
Comparison: none

[Series 1: run · 9 acquisitions, 18 frames shown]
[im 1/9]
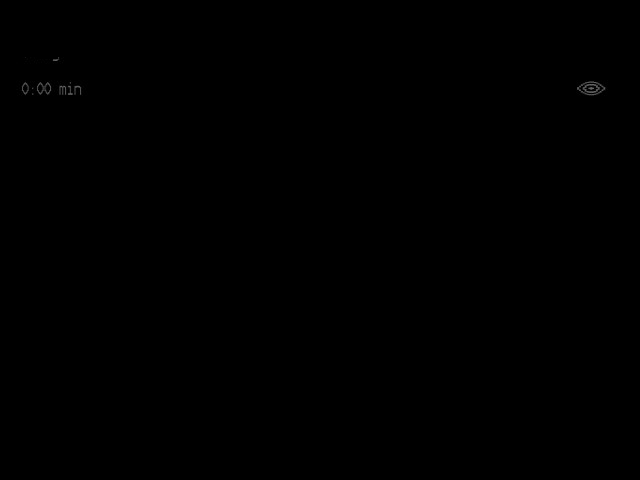
[im 1/9]
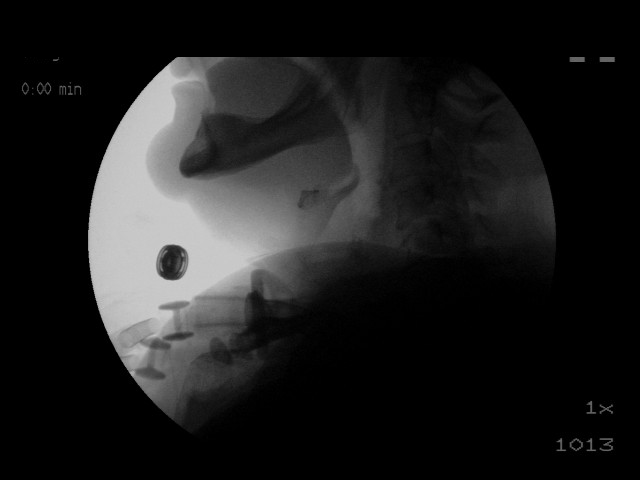
[im 2/9]
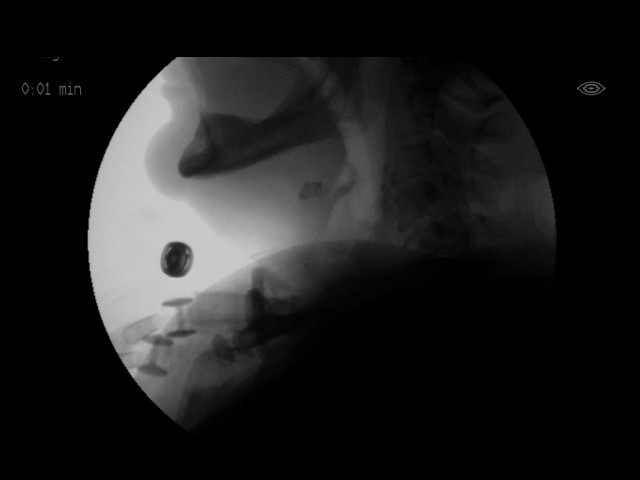
[im 2/9]
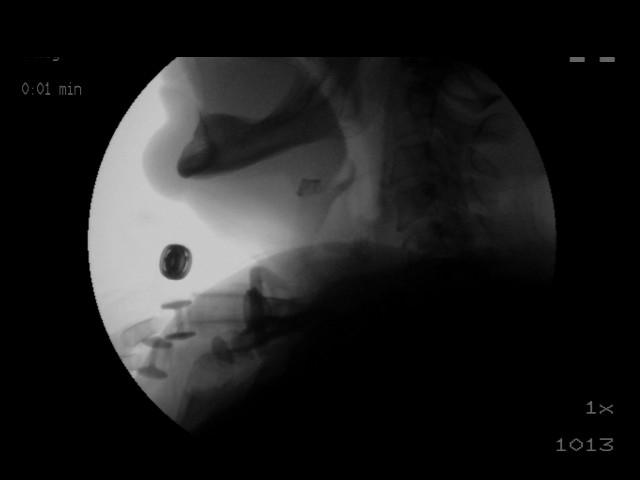
[im 3/9]
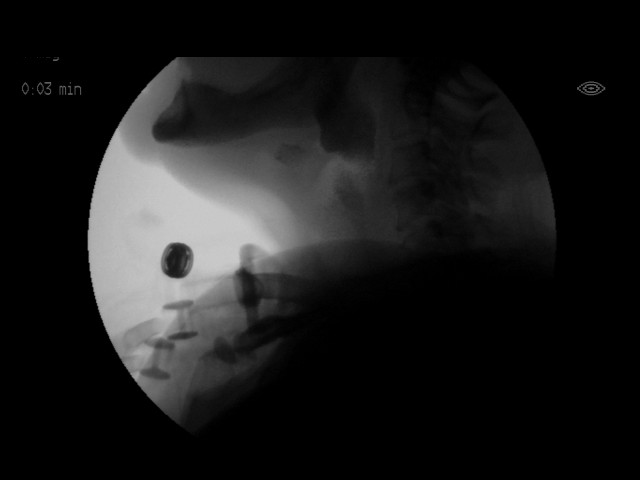
[im 3/9]
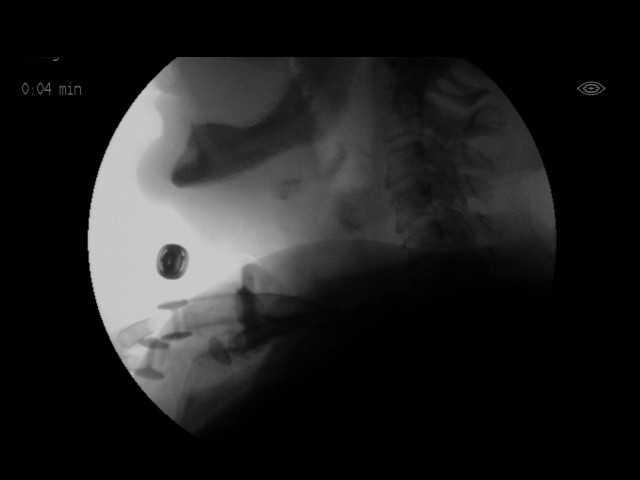
[im 4/9]
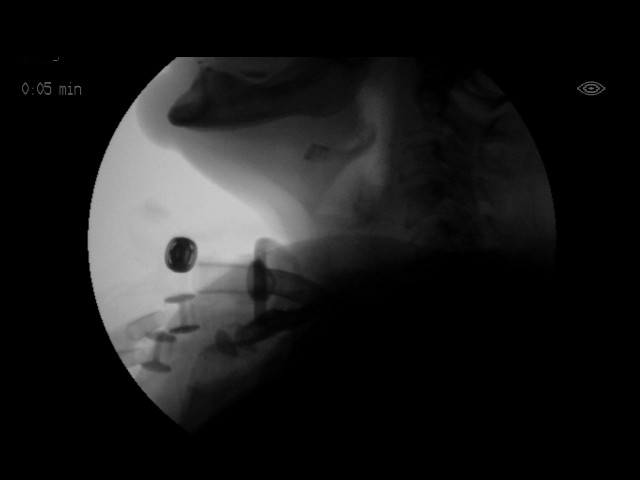
[im 4/9]
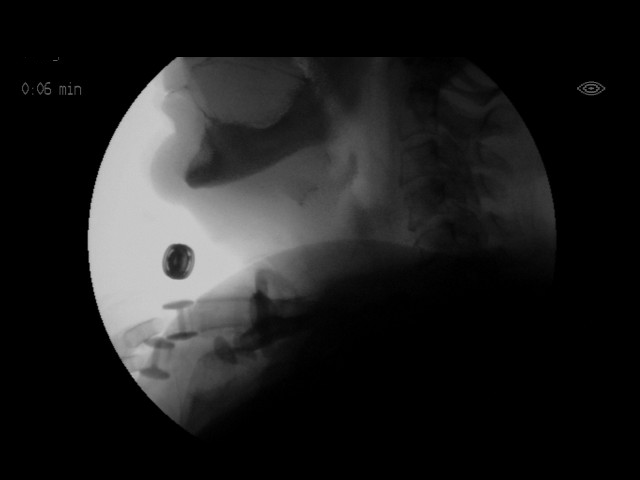
[im 5/9]
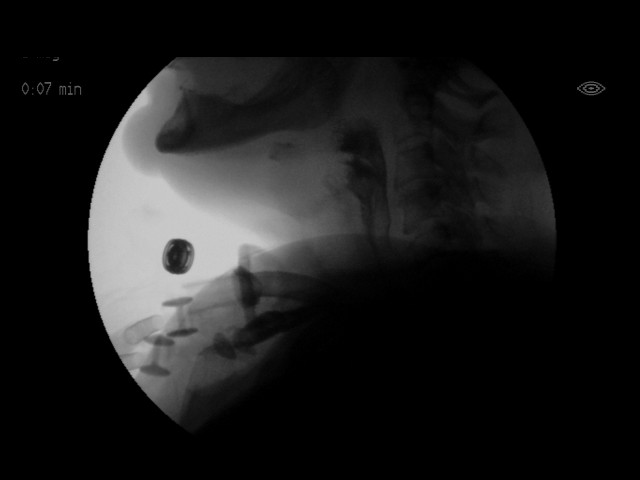
[im 5/9]
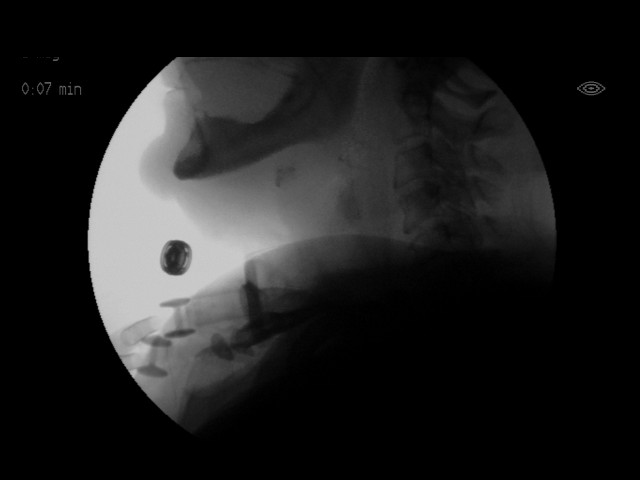
[im 6/9]
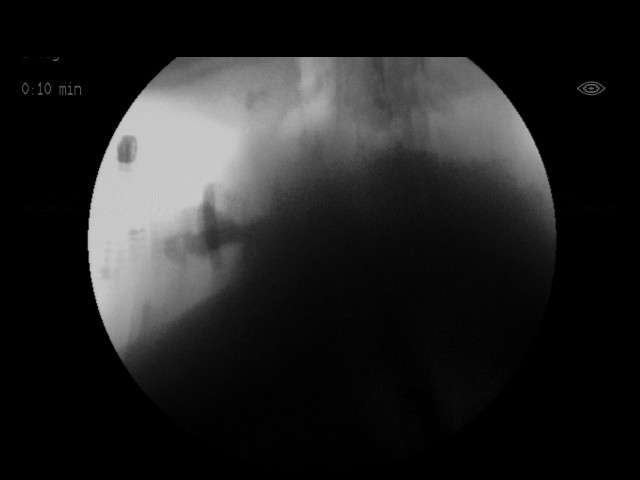
[im 6/9]
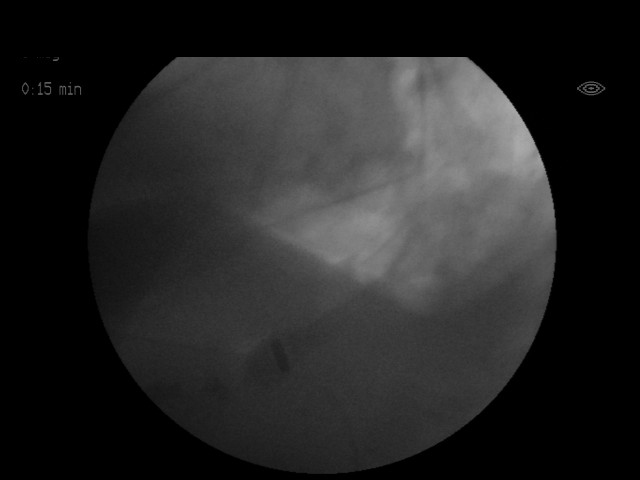
[im 7/9]
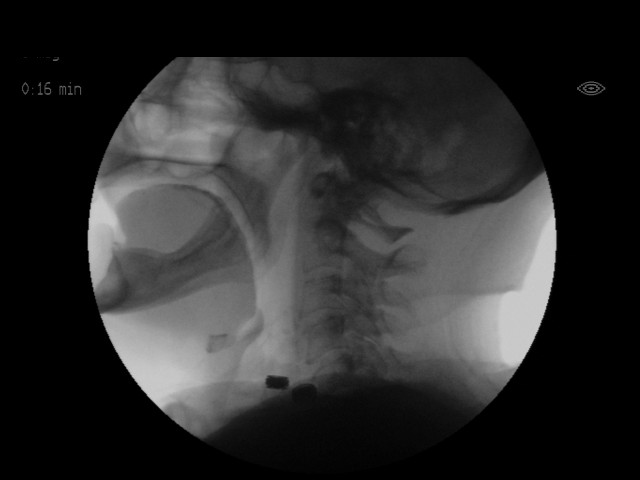
[im 7/9]
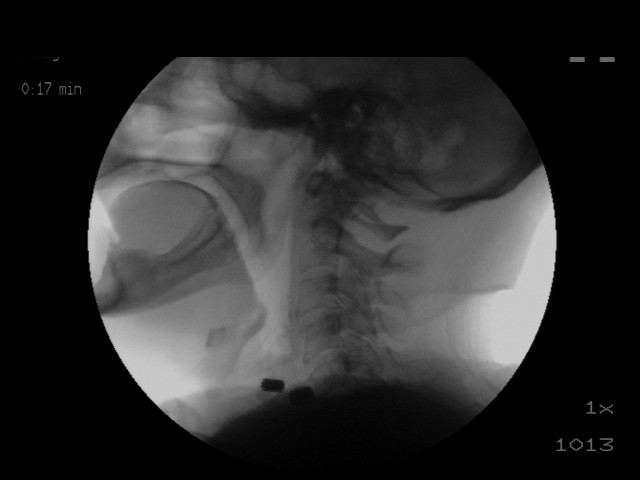
[im 8/9]
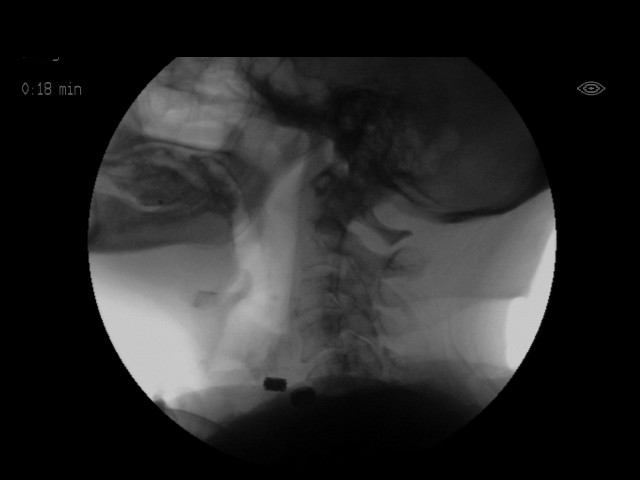
[im 8/9]
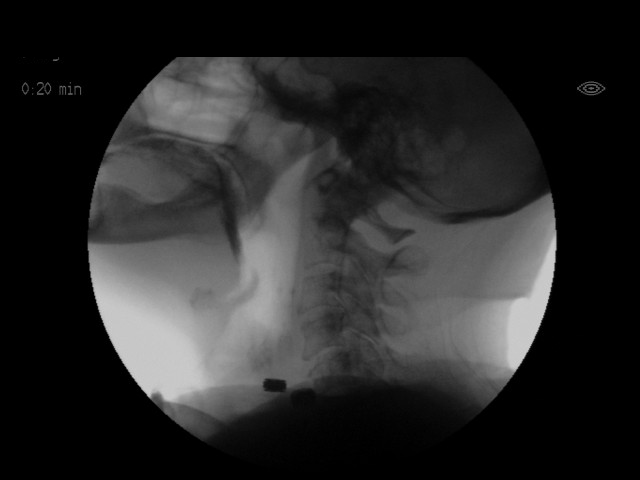
[im 9/9]
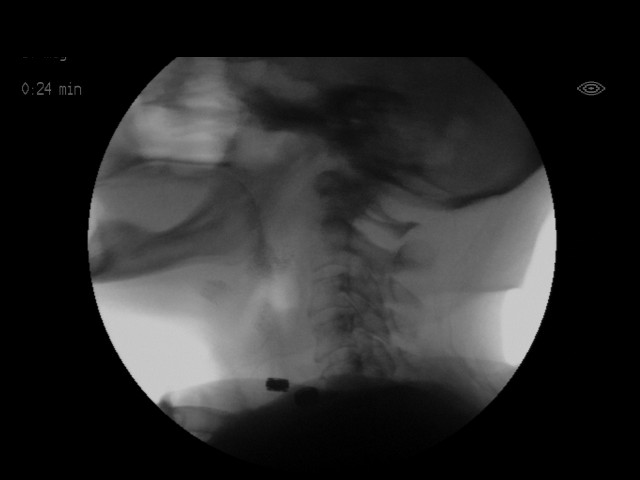
[im 9/9]
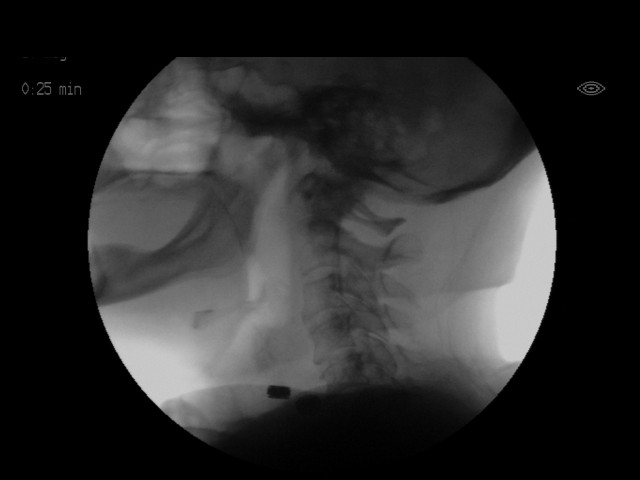

[18 of 24 positions shown; findings below may reference images not displayed]

FLUOROSCOPY FOR SWALLOWING FUNCTION STUDY:
Fluoroscopy was provided for swallowing function study, which was administered by a speech pathologist.  Final results and recommendations from this study are contained within the speech pathology report.
